# Patient Record
Sex: Female | Born: 1980 | Race: White | Hispanic: No | Marital: Married | Smoking: Former smoker
Health system: Southern US, Community
[De-identification: ages and names within clinical notes are randomized; demographics above are authoritative.]

## PROBLEM LIST (undated history)

## (undated) ENCOUNTER — Inpatient Hospital Stay: Payer: Self-pay

## (undated) DIAGNOSIS — R609 Edema, unspecified: Secondary | ICD-10-CM

## (undated) DIAGNOSIS — I493 Ventricular premature depolarization: Secondary | ICD-10-CM

## (undated) DIAGNOSIS — I10 Essential (primary) hypertension: Secondary | ICD-10-CM

## (undated) DIAGNOSIS — R252 Cramp and spasm: Secondary | ICD-10-CM

## (undated) DIAGNOSIS — F419 Anxiety disorder, unspecified: Secondary | ICD-10-CM

## (undated) DIAGNOSIS — IMO0001 Reserved for inherently not codable concepts without codable children: Secondary | ICD-10-CM

## (undated) HISTORY — DX: Ventricular premature depolarization: I49.3

## (undated) HISTORY — DX: Anxiety disorder, unspecified: F41.9

## (undated) HISTORY — DX: Essential (primary) hypertension: I10

---

## 2015-03-25 ENCOUNTER — Emergency Department

## 2015-03-25 ENCOUNTER — Emergency Department
Admission: EM | Admit: 2015-03-25 | Discharge: 2015-03-25 | Disposition: A | Discharge status: Home / Self Care | Attending: Emergency Medicine | Admitting: Emergency Medicine

## 2015-03-25 ENCOUNTER — Encounter: Payer: Self-pay | Admitting: Emergency Medicine

## 2015-03-25 DIAGNOSIS — X58XXXA Exposure to other specified factors, initial encounter: Secondary | ICD-10-CM | POA: Insufficient documentation

## 2015-03-25 DIAGNOSIS — Y9389 Activity, other specified: Secondary | ICD-10-CM | POA: Insufficient documentation

## 2015-03-25 DIAGNOSIS — Y9289 Other specified places as the place of occurrence of the external cause: Secondary | ICD-10-CM | POA: Diagnosis not present

## 2015-03-25 DIAGNOSIS — S299XXA Unspecified injury of thorax, initial encounter: Secondary | ICD-10-CM | POA: Insufficient documentation

## 2015-03-25 DIAGNOSIS — Y998 Other external cause status: Secondary | ICD-10-CM | POA: Insufficient documentation

## 2015-03-25 DIAGNOSIS — S4991XA Unspecified injury of right shoulder and upper arm, initial encounter: Secondary | ICD-10-CM | POA: Diagnosis present

## 2015-03-25 DIAGNOSIS — Z720 Tobacco use: Secondary | ICD-10-CM | POA: Diagnosis not present

## 2015-03-25 DIAGNOSIS — S46911A Strain of unspecified muscle, fascia and tendon at shoulder and upper arm level, right arm, initial encounter: Secondary | ICD-10-CM

## 2015-03-25 MED ORDER — DIAZEPAM 2 MG PO TABS: 2.0000 mg | ORAL_TABLET | Freq: Three times a day (TID) | ORAL | Status: DC | PRN

## 2015-03-25 MED ORDER — ORPHENADRINE CITRATE 30 MG/ML IJ SOLN
60.0000 mg | INTRAMUSCULAR | Status: AC
  Administered 2015-03-25: 60 mg via INTRAMUSCULAR

## 2015-03-25 MED ORDER — KETOROLAC TROMETHAMINE 10 MG PO TABS: 10.0000 mg | ORAL_TABLET | Freq: Three times a day (TID) | ORAL | Status: DC

## 2015-03-25 MED ORDER — KETOROLAC TROMETHAMINE 60 MG/2ML IM SOLN
60.0000 mg | Freq: Once | INTRAMUSCULAR | Status: AC
  Administered 2015-03-25: 60 mg via INTRAMUSCULAR

## 2015-03-25 MED ORDER — ORPHENADRINE CITRATE 30 MG/ML IJ SOLN
INTRAMUSCULAR | Status: AC
  Administered 2015-03-25: 60 mg via INTRAMUSCULAR
  Filled 2015-03-25: qty 2

## 2015-03-25 MED ORDER — KETOROLAC TROMETHAMINE 60 MG/2ML IM SOLN
INTRAMUSCULAR | Status: AC
  Administered 2015-03-25: 60 mg via INTRAMUSCULAR
  Filled 2015-03-25: qty 2

## 2015-03-25 NOTE — ED Notes (Signed)
C/o right shoulder pain that started 2 weeks ago. States it has become increasingly worse.  Pain is 7 out of 10.

## 2015-03-25 NOTE — Discharge Instructions (Signed)
Muscle Strain A muscle strain (pulled muscle) happens when a muscle is stretched beyond normal length. It happens when a sudden, violent force stretches your muscle too far. Usually, a few of the fibers in your muscle are torn. Muscle strain is common in athletes. Recovery usually takes 1-2 weeks. Complete healing takes 5-6 weeks.  HOME CARE   Follow the PRICE method of treatment to help your injury get better. Do this the first 2-3 days after the injury:  Protect. Protect the muscle to keep it from getting injured again.  Rest. Limit your activity and rest the injured body part.  Ice. Put ice in a plastic bag. Place a towel between your skin and the bag. Then, apply the ice and leave it on from 15-20 minutes each hour. After the third day, switch to moist heat packs.  Compression. Use a splint or elastic bandage on the injured area for comfort. Do not put it on too tightly.  Elevate. Keep the injured body part above the level of your heart.  Only take medicine as told by your doctor.  Warm up before doing exercise to prevent future muscle strains. GET HELP IF:   You have more pain or puffiness (swelling) in the injured area.  You feel numbness, tingling, or notice a loss of strength in the injured area. MAKE SURE YOU:   Understand these instructions.  Will watch your condition.  Will get help right away if you are not doing well or get worse. Document Released: 07/16/2008 Document Revised: 07/28/2013 Document Reviewed: 05/06/2013 [facility] Patient Information 2015 [place], [place]. This information is not intended to replace advice given to you by your health care provider. Make sure you discuss any questions you have with your health care provider.  Take the prescription meds as directed.  Apply ice and moist heat to reduce symptoms.  Follow-up with your provider as scheduled for further care.  Consider seeing a massage therapist or chiropractor for prolonged symptoms.

## 2015-03-25 NOTE — ED Notes (Signed)
Reports pain in right shoulder x 2 weeks.  States it feels like glass is in the joint

## 2015-03-25 NOTE — ED Notes (Signed)
Pt unable to sign due to computer down in room, instructions and RX given to patient.

## 2015-03-25 NOTE — ED Provider Notes (Signed)
[facility] Provider Note ____________________________________________  Time seen: 1323  I have reviewed the triage vital signs and the nursing notes.  HISTORY  Chief Complaint Shoulder Pain  HPI Michelle Cosslizabeth Eoff is a 34 y.o. female reports to the ED with a 2 week complaint of posterior right upper back pain. She denies any known injury or trauma accident or fall. She does admit to working out regularly, but is unclear of any specific weight lifting injury as well. She describes pain between the right shoulder blade and spine, that is very exquisitely tender to touch. He denies any chest pain shortness of breath or referral of pain. She has doses Motrin, and applied Salonpas, and heating pad without significant relief. Symptoms increase with movement of the upper arm and shoulder. Patient rates pain at 7/10 at triage.  History reviewed. No pertinent past medical history.  There are no active problems to display for this patient.  Past Surgical History  Procedure Laterality Date  . Cesarean section     Current Outpatient Rx  Name  Route  Sig  Dispense  Refill  . diazepam (VALIUM) 2 MG tablet   Oral   Take 1 tablet (2 mg total) by mouth every 8 (eight) hours as needed for muscle spasms.   10 tablet   0   . ketorolac (TORADOL) 10 MG tablet   Oral   Take 1 tablet (10 mg total) by mouth every 8 (eight) hours.   15 tablet   0    Allergies Review of patient's allergies indicates no known allergies.  History reviewed. No pertinent family history.  Social History History  Substance Use Topics  . Smoking status: Current Every Day Smoker  . Smokeless tobacco: Not on file  . Alcohol Use: Yes   Review of Systems  Constitutional: Negative for fever. Eyes: Negative for visual changes. ENT: Negative for sore throat. Cardiovascular: Negative for chest pain. Respiratory: Negative for shortness of breath. Gastrointestinal: Negative  for abdominal pain, vomiting and diarrhea. Genitourinary: Negative for dysuria. Musculoskeletal: Positive for upper back pain. Positive for right shoulder pain. Skin: Negative for rash. Neurological: Negative for headaches, focal weakness or numbness. ___________________________________________  PHYSICAL EXAM:  VITAL SIGNS: ED Triage Vitals  Enc Vitals Group     BP 03/25/15 1231 160/91 mmHg     Pulse Rate 03/25/15 1231 81     Resp 03/25/15 1231 20     Temp 03/25/15 1231 98.1 F (36.7 C)     Temp Source 03/25/15 1231 Oral     SpO2 03/25/15 1231 99 %     Weight 03/25/15 1231 150 lb (68.04 kg)     Height 03/25/15 1231 5\' 6"  (1.676 m)     Head Cir --      Peak Flow --      Pain Score 03/25/15 1232 7     Pain Loc --      Pain Edu? --      Excl. in GC? --    Constitutional: Alert and oriented. Well appearing and in no distress. Eyes: Conjunctivae are normal. PERRL. Normal extraocular movements. ENT   Head: Normocephalic and atraumatic.   Nose: No congestion/rhinnorhea.   Mouth/Throat: Mucous membranes are moist.   Neck: No stridor. Hematological/Lymphatic/Immunilogical: No cervical lymphadenopathy. Cardiovascular: Normal rate, regular rhythm.  Respiratory: Normal respiratory effort.No wheezes/rales/rhonchi. Gastrointestinal: Soft and nontender. No distention. Musculoskeletal: Nontender with normal range of motion in all extremities. Patient tender to touch with palpable muscle fullness to the right  upper rhomboid. Normal rotator cuff strength testing.  Neurologic:  Normal speech and language. No gross focal neurologic deficits are appreciated. CN II-XII grossly intact.  Skin:  Skin is warm, dry and intact. No rash noted. Psychiatric: Mood and affect are normal. Patient exhibits appropriate insight and judgment. ____________________________________________   RADIOLOGY Right Shoulder - negative ____________________________________________  PROCEDURES  Toradol 60  mg IM  Norflex 60 mg IM ____________________________________________  INITIAL IMPRESSION / ASSESSMENT AND PLAN / ED COURSE  Reassurance to patient about about acute scapulothoracic muscle strain and spasm.  Discussed treatment with anti-inflammatory - Toradol and Valium for spasms and ice.  Demonstrated tissue mobilization techniques and suggested chiropractor or massage therapy.  ____________________________________________  FINAL CLINICAL IMPRESSION(S) / ED DIAGNOSES  Final diagnoses:  Muscle strain of scapular region, right, initial encounter     Lissa Hoard, PA-C 03/25/15 1918  Jene Every, MD 03/26/15 [phone]

## 2015-06-28 ENCOUNTER — Emergency Department
Admission: EM | Admit: 2015-06-28 | Discharge: 2015-06-28 | Discharge status: Against Medical Advice | Attending: Emergency Medicine | Admitting: Emergency Medicine

## 2015-06-28 ENCOUNTER — Encounter: Payer: Self-pay | Admitting: Emergency Medicine

## 2015-06-28 DIAGNOSIS — R110 Nausea: Secondary | ICD-10-CM | POA: Insufficient documentation

## 2015-06-28 DIAGNOSIS — R531 Weakness: Secondary | ICD-10-CM | POA: Insufficient documentation

## 2015-06-28 DIAGNOSIS — Z720 Tobacco use: Secondary | ICD-10-CM | POA: Diagnosis not present

## 2015-06-28 LAB — BASIC METABOLIC PANEL
Anion gap: 8 (ref 5–15)
BUN: 18 mg/dL (ref 6–20)
CO2: 23 mmol/L (ref 22–32)
Calcium: 9.9 mg/dL (ref 8.9–10.3)
Chloride: 107 mmol/L (ref 101–111)
Creatinine, Ser: 0.71 mg/dL (ref 0.44–1.00)
GFR calc Af Amer: >60 mL/min (ref >60)
GFR calc non Af Amer: >60 mL/min (ref >60)
GLUCOSE: 108 mg/dL — AB (ref 65–99)
POTASSIUM: 4 mmol/L (ref 3.5–5.1)
Sodium: 138 mmol/L (ref 135–145)

## 2015-06-28 LAB — CBC
HEMATOCRIT: 43.7 % (ref 35.0–47.0)
Hemoglobin: 14.7 g/dL (ref 12.0–16.0)
MCH: 32.1 pg (ref 26.0–34.0)
MCHC: 33.8 g/dL (ref 32.0–36.0)
MCV: 95.1 fL (ref 80.0–100.0)
Platelets: 318 10*3/uL (ref 150–440)
RBC: 4.59 MIL/uL (ref 3.80–5.20)
RDW: 12.5 % (ref 11.5–14.5)
WBC: 10.9 10*3/uL (ref 3.6–11.0)

## 2015-06-28 LAB — URINALYSIS COMPLETE WITH MICROSCOPIC (ARMC ONLY)
Bilirubin Urine: NEGATIVE
GLUCOSE, UA: NEGATIVE mg/dL
Hgb urine dipstick: NEGATIVE
Ketones, ur: NEGATIVE mg/dL
NITRITE: NEGATIVE
Protein, ur: NEGATIVE mg/dL
Specific Gravity, Urine: 1.008 (ref 1.005–1.030)
pH: 6 (ref 5.0–8.0)

## 2015-06-28 NOTE — ED Notes (Signed)
Pt here for weakness and nausea. States she feels at though she is going to pass out.

## 2015-06-29 ENCOUNTER — Telehealth: Payer: Self-pay | Admitting: Emergency Medicine

## 2015-06-29 NOTE — ED Notes (Signed)
Called patient due to lwot to inquire about condition and follow up plans. Patient says she has appt today for follow up.

## 2015-07-10 ENCOUNTER — Other Ambulatory Visit: Payer: Self-pay | Admitting: Orthopedic Surgery

## 2015-07-10 DIAGNOSIS — M958 Other specified acquired deformities of musculoskeletal system: Secondary | ICD-10-CM

## 2015-07-17 ENCOUNTER — Other Ambulatory Visit: Payer: Self-pay | Admitting: Orthopedic Surgery

## 2015-07-17 ENCOUNTER — Ambulatory Visit
Admission: RE | Admit: 2015-07-17 | Discharge: 2015-07-17 | Disposition: A | Discharge status: Home / Self Care | Source: Ambulatory Visit | Attending: Orthopedic Surgery | Admitting: Orthopedic Surgery

## 2015-07-17 DIAGNOSIS — M958 Other specified acquired deformities of musculoskeletal system: Secondary | ICD-10-CM

## 2015-07-17 MED ORDER — GADOBENATE DIMEGLUMINE 529 MG/ML IV SOLN
15.0000 mL | Freq: Once | INTRAVENOUS | Status: AC | PRN
  Administered 2015-07-17: 14 mL via INTRAVENOUS

## 2015-10-02 ENCOUNTER — Encounter: Payer: Self-pay | Admitting: Certified Nurse Midwife

## 2015-10-02 ENCOUNTER — Ambulatory Visit (INDEPENDENT_AMBULATORY_CARE_PROVIDER_SITE_OTHER): Admitting: Certified Nurse Midwife

## 2015-10-02 VITALS — BP 124/91 | HR 78 | Ht 65.6 in | Wt 153.2 lb

## 2015-10-02 DIAGNOSIS — N926 Irregular menstruation, unspecified: Secondary | ICD-10-CM | POA: Diagnosis not present

## 2015-10-02 LAB — POCT URINE PREGNANCY: PREG TEST UR: POSITIVE — AB

## 2015-10-02 NOTE — Progress Notes (Signed)
Patient ID: Michelle CossElizabeth Owen, female   DOB: [birth year], 34 y.o.   MRN: [id number] Pt presents for confirmation of pregnancy. UPT: POSITIVE. LMP: 08/24/2015. EGA: 5.4 days; EDD: 05/30/2016.  G2 P1001. No vaginal bleeding. Having some period like cramping, a little nausea.

## 2015-10-02 NOTE — Progress Notes (Signed)
Patient ID: Michelle Owen, female   DOB: June 12, 1981, 34 y.o.   MRN: [id number]  Chief Complaint  Patient presents with  . Establish Care    confirmation of pregnancy    HPI Michelle Cosslizabeth Knerr is a 34 y.o. female.  Presents for missed menses.   Subjective:    Michelle Cosslizabeth Gelles is a 34 y.o. female who presents for evaluation of amenorrhea. She believes she could be pregnant. Pregnancy is desired.  This is an unplanned pregnancy.  She just adopted her two nieces.  Sexual Activity: single partner, contraception: none. Current symptoms also include: breast tenderness, fatigue, nausea and positive home pregnancy test. She has not used birth control in 5 years.Last period was normal.   Patient's last menstrual period was 08/24/2015 (exact date). The following portions of the patient's history were reviewed and updated as appropriate: allergies, current medications, past family history, past medical history, past social history, past surgical history and problem list.  Review of Systems Pertinent items noted in HPI and remainder of comprehensive ROS otherwise negative.     HPI  Past Medical History  Diagnosis Date  . Anxiety   No Medications at this time for anxiety.  She has used valium in the past.  Past Surgical History  Procedure Laterality Date  . Cesarean section    For fetal distress, Nuchal cord.  No family history on file.  Social History Social History  Substance Use Topics  . Smoking status: Former Smoker    Quit date: 09/02/2015  . Smokeless tobacco: Never Used  . Alcohol Use: No    No Known Allergies  No current outpatient prescriptions on file.   No current facility-administered medications for this visit.    Review of Systems Review of Systems  Constitutional: Positive for activity change and appetite change.  Respiratory: Negative for shortness of breath.   Cardiovascular: Negative for chest pain.  Gastrointestinal: Negative for abdominal pain and  abdominal distention.  Genitourinary: Negative for difficulty urinating, menstrual problem and pelvic pain.    Blood pressure 124/91, pulse 78, height 5' 5.6" (1.666 m), weight 153 lb 3 oz (69.485 kg), last menstrual period 08/24/2015.  Physical Exam Physical Exam  Constitutional: She is oriented to person, place, and time. She appears well-developed and well-nourished.  Cardiovascular: Normal rate and regular rhythm.   Pulmonary/Chest: Effort normal and breath sounds normal.  Abdominal: Soft. She exhibits no distension. There is no tenderness.  Neurological: She is alert and oriented to person, place, and time. She has normal reflexes.  Skin: Skin is warm and dry.  Psychiatric: She has a normal mood and affect. Her behavior is normal. Judgment and thought content normal.  Nursing note and vitals reviewed.   Data Reviewed Pregnancy test positive  Assessment    Missed menses     Plan    Return in 4 weeks for dating scan & NOB intake        Ardelia MemsSharon Kenzley Ke 10/02/2015, 3:53 PM

## 2015-10-22 NOTE — L&D Delivery Note (Signed)
Delivery Summary for [facility]  Labor Events:   Preterm labor:   Rupture date:   Rupture time:   Rupture type:   Fluid Color:   Induction:   Augmentation:   Complications:   Cervical ripening:          Delivery:   Episiotomy:   Lacerations:   Repair suture:   Repair # of packets:   Blood loss (ml): 500 ml   Information for the patient's newborn:  Bernetta, Desarro [[id number]]    Delivery 05/15/2016 2:09 PM by  C-Section, Low Transverse Sex:  female Gestational Age: [redacted]w[redacted]d Delivery Clinician:   Living?:         APGARS  One minute Five minutes Ten minutes  Skin color:        Heart rate:        Grimace:        Muscle tone:        Breathing:        Totals: 8  9      Presentation/position:      Resuscitation:   Cord information:    Disposition of cord blood:     Blood gases sent?  Complications:   Placenta: Delivered:       appearance Newborn Measurements: Weight: 6 lb 15.1 oz (3150 g)  Height: 19.09"  Head circumference:    Chest circumference:    Other providers:    Additional  information: Forceps:   Vacuum:   Breech:   Observed anomalies        See. Dr. Oretha Milch operative note for details of C-section procedure.    Hildred Laser, MD [facility]

## 2015-10-23 ENCOUNTER — Encounter: Payer: Self-pay | Admitting: Emergency Medicine

## 2015-10-23 ENCOUNTER — Emergency Department
Admission: EM | Admit: 2015-10-23 | Discharge: 2015-10-23 | Disposition: A | Discharge status: Home / Self Care | Attending: Emergency Medicine | Admitting: Emergency Medicine

## 2015-10-23 DIAGNOSIS — R42 Dizziness and giddiness: Secondary | ICD-10-CM | POA: Diagnosis not present

## 2015-10-23 DIAGNOSIS — O9989 Other specified diseases and conditions complicating pregnancy, childbirth and the puerperium: Secondary | ICD-10-CM | POA: Insufficient documentation

## 2015-10-23 DIAGNOSIS — Z3A08 8 weeks gestation of pregnancy: Secondary | ICD-10-CM | POA: Diagnosis not present

## 2015-10-23 DIAGNOSIS — Z87891 Personal history of nicotine dependence: Secondary | ICD-10-CM | POA: Diagnosis not present

## 2015-10-23 DIAGNOSIS — Z79899 Other long term (current) drug therapy: Secondary | ICD-10-CM | POA: Insufficient documentation

## 2015-10-23 DIAGNOSIS — R51 Headache: Secondary | ICD-10-CM | POA: Diagnosis not present

## 2015-10-23 DIAGNOSIS — R030 Elevated blood-pressure reading, without diagnosis of hypertension: Secondary | ICD-10-CM | POA: Insufficient documentation

## 2015-10-23 LAB — CBC
HCT: 41.8 % (ref 35.0–47.0)
HEMOGLOBIN: 14.3 g/dL (ref 12.0–16.0)
MCH: 32.1 pg (ref 26.0–34.0)
MCHC: 34.2 g/dL (ref 32.0–36.0)
MCV: 94 fL (ref 80.0–100.0)
Platelets: 297 10*3/uL (ref 150–440)
RBC: 4.45 MIL/uL (ref 3.80–5.20)
RDW: 12.6 % (ref 11.5–14.5)
WBC: 15.7 10*3/uL — ABNORMAL HIGH (ref 3.6–11.0)

## 2015-10-23 LAB — COMPREHENSIVE METABOLIC PANEL
ALK PHOS: 61 U/L (ref 38–126)
ALT: 17 U/L (ref 14–54)
ANION GAP: 8 (ref 5–15)
AST: 17 U/L (ref 15–41)
Albumin: 3.9 g/dL (ref 3.5–5.0)
BILIRUBIN TOTAL: 0.7 mg/dL (ref 0.3–1.2)
BUN: 11 mg/dL (ref 6–20)
CALCIUM: 9.3 mg/dL (ref 8.9–10.3)
CO2: 23 mmol/L (ref 22–32)
Chloride: 105 mmol/L (ref 101–111)
Creatinine, Ser: 0.48 mg/dL (ref 0.44–1.00)
GFR calc non Af Amer: >60 mL/min (ref >60)
Glucose, Bld: 85 mg/dL (ref 65–99)
Potassium: 3.5 mmol/L (ref 3.5–5.1)
SODIUM: 136 mmol/L (ref 135–145)
TOTAL PROTEIN: 7.1 g/dL (ref 6.5–8.1)

## 2015-10-23 NOTE — ED Notes (Signed)
Pt states she has had headache x 1 week intermittently and she has checked her bp twice today and it was 140/95, then 150/110. Pt states her OB-GYN office is closed today. She is [redacted] weeks pregnant with no complications thus far. Pt alert & oriented with NAD.

## 2015-10-23 NOTE — ED Notes (Signed)
Pt to ed with c/o headache, nausea, elevated blood pressure x 2 weeks.  Pt states she is approx [redacted] weeks pregnant,  G2, P1,  Seen at [facility].

## 2015-10-23 NOTE — ED Provider Notes (Signed)
[facility] Emergency Department Provider Note  ____________________________________________  Time seen: On arrival  I have reviewed the triage vital signs and the nursing notes.   HISTORY  Chief Complaint Headache and Hypertension    HPI Michelle Owen is a 35 y.o. female who presents with complaints of intermittent headache for one week and high blood pressure. Patient reports she is [redacted] weeks pregnant and was feelingtired and slightly dizzy today so checked her blood pressure at a pharmacy and noted to be elevated. Given that she is pretty she became concerned and came to the emergency department. She reports she is feeling better here. She follows with [facility]. No abdominal pain no vaginal bleeding. No issues during this pregnancy. No visual changes, no neuro deficits. No dysuria, no cough, no rash     Past Medical History  Diagnosis Date  . Anxiety     There are no active problems to display for this patient.   Past Surgical History  Procedure Laterality Date  . Cesarean section      Current Outpatient Rx  Name  Route  Sig  Dispense  Refill  . Prenatal Vit-Fe Fumarate-FA (PRENATAL MULTIVITAMIN) TABS tablet   Oral   Take 1 tablet by mouth daily at 12 noon.           Allergies Review of patient's allergies indicates no known allergies.  History reviewed. No pertinent family history.  Social History Social History  Substance Use Topics  . Smoking status: Former Smoker    Quit date: 09/02/2015  . Smokeless tobacco: Never Used  . Alcohol Use: No    Review of Systems  Constitutional: Negative for fever. Eyes: Negative for visual changes. ENT: Negative for sore throat Cardiovascular: Negative for chest pain. Respiratory: Negative for cough Gastrointestinal: Negative for abdominal pain no pelvic pain Genitourinary: Negative for dysuria. She'll bleeding Musculoskeletal: Negative for neck pain Skin: Negative for  rash. Neurological: Negative  focal weakness Psychiatric: No anxiety    ____________________________________________   PHYSICAL EXAM:  VITAL SIGNS: ED Triage Vitals  Enc Vitals Group     BP 10/23/15 1042 159/100 mmHg     Pulse Rate 10/23/15 1042 92     Resp 10/23/15 1042 18     Temp 10/23/15 1042 97.5 F (36.4 C)     Temp Source 10/23/15 1042 Oral     SpO2 10/23/15 1042 100 %     Weight 10/23/15 1042 155 lb (70.308 kg)     Height 10/23/15 1042 5\' 7"  (1.702 m)     Head Cir --      Peak Flow --      Pain Score 10/23/15 1043 0     Pain Loc --      Pain Edu? --      Excl. in GC? --      Constitutional: Alert and oriented. Well appearing and in no distress. Eyes: Conjunctivae are normal.  ENT   Head: Normocephalic and atraumatic.   Mouth/Throat: Mucous membranes are moist. Cardiovascular: Normal rate, regular rhythm. Normal and symmetric distal pulses are present in all extremities. Respiratory: Normal respiratory effort without tachypnea nor retractions.  Gastrointestinal: Soft and non-tender in all quadrants. No distention. There is no CVA tenderness. Genitourinary: deferred Musculoskeletal: Nontender with normal range of motion in all extremities. No lower extremity tenderness nor edema. Neurologic:  Normal speech and language. No gross focal neurologic deficits are appreciated. Skin:  Skin is warm, dry and intact. No rash noted. Psychiatric: Mood and affect  are normal. Patient exhibits appropriate insight and judgment.  ____________________________________________    LABS (pertinent positives/negatives)  Labs Reviewed  CBC - Abnormal; Notable for the following:    WBC 15.7 (*)    All other components within normal limits  COMPREHENSIVE METABOLIC PANEL    ____________________________________________   EKG  None ____________________________________________    RADIOLOGY I have personally reviewed any xrays that were ordered on this  patient: None  ____________________________________________   PROCEDURES  Procedure(s) performed: none  Critical Care performed: none  ____________________________________________   INITIAL IMPRESSION / ASSESSMENT AND PLAN / ED COURSE  Pertinent labs & imaging results that were available during my care of the patient were reviewed by me and considered in my medical decision making (see chart for details).  Patient well-appearing and in no distress. Blood pressures are improved in the emergency department without intervention. Labs are significant for a mildly elevated white blood cell count of uncertain significance. No evidence of infection based on her history of present illness an vitals.   Discussed with Dr. Valentino Saxonherry of [facility] recommends outpatient follow-up and no further ed testing ____________________________________________   FINAL CLINICAL IMPRESSION(S) / ED DIAGNOSES  Final diagnoses:  Elevated blood pressure (not hypertension)     Jene Everyobert Melanee Cordial, MD 10/23/15 [phone]

## 2015-10-24 ENCOUNTER — Encounter: Payer: Self-pay | Admitting: Certified Nurse Midwife

## 2015-10-24 ENCOUNTER — Ambulatory Visit (INDEPENDENT_AMBULATORY_CARE_PROVIDER_SITE_OTHER): Admitting: Certified Nurse Midwife

## 2015-10-24 VITALS — BP 124/103 | HR 100 | Wt 154.5 lb

## 2015-10-24 DIAGNOSIS — Z331 Pregnant state, incidental: Secondary | ICD-10-CM

## 2015-10-24 DIAGNOSIS — R030 Elevated blood-pressure reading, without diagnosis of hypertension: Secondary | ICD-10-CM

## 2015-10-24 DIAGNOSIS — Z3490 Encounter for supervision of normal pregnancy, unspecified, unspecified trimester: Secondary | ICD-10-CM

## 2015-10-24 DIAGNOSIS — IMO0001 Reserved for inherently not codable concepts without codable children: Secondary | ICD-10-CM

## 2015-10-24 LAB — POCT URINALYSIS DIPSTICK
Bilirubin, UA: NEGATIVE
Glucose, UA: NEGATIVE
Ketones, UA: NEGATIVE
Leukocytes, UA: NEGATIVE
NITRITE UA: NEGATIVE
RBC UA: NEGATIVE
Spec Grav, UA: 1.02
UROBILINOGEN UA: NEGATIVE
pH, UA: 6

## 2015-10-24 MED ORDER — LABETALOL HCL 100 MG PO TABS: 100.0000 mg | ORAL_TABLET | Freq: Two times a day (BID) | ORAL | Status: DC

## 2015-10-24 NOTE — Progress Notes (Signed)
Subjective:     Patient ID: Michelle Owen, female   DOB: 10-13-1981, 35 y.o.   MRN: [id number]  HPI  Patient denies any history of blood pressure issues in the past.  Her blood pressure has been elevated her last 4 doctor visits (once here) and was seen in ED for elevated blood pressure   Review of Systems  Eyes: Positive for visual disturbance.  Respiratory: Negative for cough and shortness of breath.   Cardiovascular: Negative for chest pain and leg swelling.  Genitourinary: Negative for flank pain and dyspareunia.  Neurological: Positive for headaches.       Seeing spots  Psychiatric/Behavioral: The patient is nervous/anxious.        She is raising her sisters kids       Objective:   Physical Exam  Constitutional: She is oriented to person, place, and time. She appears well-developed and well-nourished.  Cardiovascular: Normal rate.   Pulmonary/Chest: Effort normal and breath sounds normal.  Neurological: She is alert and oriented to person, place, and time. She has normal reflexes.  Skin: Skin is warm and dry.  No edema  Nursing note and vitals reviewed.      Assessment:     Elevated BP in first trimester of pregnancy     Plan:     Reviewed patient with Dr Valentino Saxonherry and will do PIH labs, start Labetalol 100 mg BID and transfer care to Dr Valentino Saxonherry for prenatal care.  Keep NOB intake appointment.  See Dr. Valentino Saxonherry in 2 weeks.

## 2015-10-25 LAB — CBC WITH DIFFERENTIAL/PLATELET
BASOS: 0 %
Basophils Absolute: 0 10*3/uL (ref 0.0–0.2)
EOS (ABSOLUTE): 0 10*3/uL (ref 0.0–0.4)
EOS: 0 %
HEMATOCRIT: 40.9 % (ref 34.0–46.6)
Hemoglobin: 14.6 g/dL (ref 11.1–15.9)
IMMATURE GRANULOCYTES: 0 %
Immature Grans (Abs): 0 10*3/uL (ref 0.0–0.1)
LYMPHS ABS: 2.5 10*3/uL (ref 0.7–3.1)
Lymphs: 18 %
MCH: 32.8 pg (ref 26.6–33.0)
MCHC: 35.7 g/dL (ref 31.5–35.7)
MCV: 92 fL (ref 79–97)
MONOS ABS: 0.7 10*3/uL (ref 0.1–0.9)
Monocytes: 5 %
NEUTROS ABS: 10.6 10*3/uL — AB (ref 1.4–7.0)
NEUTROS PCT: 77 %
PLATELETS: 367 10*3/uL (ref 150–379)
RBC: 4.45 x10E6/uL (ref 3.77–5.28)
RDW: 12.3 % (ref 12.3–15.4)
WBC: 13.8 10*3/uL — ABNORMAL HIGH (ref 3.4–10.8)

## 2015-10-25 LAB — PROTEIN / CREATININE RATIO, URINE
CREATININE, UR: 208 mg/dL
PROTEIN UR: 26.6 mg/dL
PROTEIN/CREAT RATIO: 128 mg/g{creat} (ref 0–200)

## 2015-10-25 LAB — RENAL FUNCTION PANEL
Albumin: 4.4 g/dL (ref 3.5–5.5)
BUN / CREAT RATIO: 22 — AB (ref 8–20)
BUN: 12 mg/dL (ref 6–20)
CHLORIDE: 97 mmol/L (ref 96–106)
CO2: 22 mmol/L (ref 18–29)
Calcium: 9.6 mg/dL (ref 8.7–10.2)
Creatinine, Ser: 0.55 mg/dL — ABNORMAL LOW (ref 0.57–1.00)
GFR, EST AFRICAN AMERICAN: 142 mL/min/{1.73_m2} (ref >59)
GFR, EST NON AFRICAN AMERICAN: 123 mL/min/{1.73_m2} (ref >59)
GLUCOSE: 87 mg/dL (ref 65–99)
POTASSIUM: 3.8 mmol/L (ref 3.5–5.2)
Phosphorus: 4.8 mg/dL — ABNORMAL HIGH (ref 2.5–4.5)
SODIUM: 135 mmol/L (ref 134–144)

## 2015-10-25 LAB — HEPATIC FUNCTION PANEL
ALT: 18 IU/L (ref 0–32)
AST: 18 IU/L (ref 0–40)
Alkaline Phosphatase: 71 IU/L (ref 39–117)
Bilirubin Total: 0.5 mg/dL (ref 0.0–1.2)
Bilirubin, Direct: 0.13 mg/dL (ref 0.00–0.40)
Total Protein: 7.4 g/dL (ref 6.0–8.5)

## 2015-10-25 LAB — MICROALBUMIN / CREATININE URINE RATIO
MICROALB/CREAT RATIO: 13.8 mg/g creat (ref 0.0–30.0)
Microalbumin, Urine: 28.7 ug/mL

## 2015-10-25 LAB — URIC ACID: Uric Acid: 3 mg/dL (ref 2.5–7.1)

## 2015-10-30 ENCOUNTER — Other Ambulatory Visit: Payer: Self-pay | Admitting: Certified Nurse Midwife

## 2015-10-30 DIAGNOSIS — Z3687 Encounter for antenatal screening for uncertain dates: Secondary | ICD-10-CM

## 2015-11-03 ENCOUNTER — Ambulatory Visit (INDEPENDENT_AMBULATORY_CARE_PROVIDER_SITE_OTHER)

## 2015-11-03 ENCOUNTER — Other Ambulatory Visit: Payer: Self-pay

## 2015-11-03 VITALS — BP 115/74 | HR 92 | Wt 148.9 lb

## 2015-11-03 DIAGNOSIS — Z113 Encounter for screening for infections with a predominantly sexual mode of transmission: Secondary | ICD-10-CM

## 2015-11-03 DIAGNOSIS — R030 Elevated blood-pressure reading, without diagnosis of hypertension: Secondary | ICD-10-CM

## 2015-11-03 DIAGNOSIS — Z331 Pregnant state, incidental: Secondary | ICD-10-CM

## 2015-11-03 DIAGNOSIS — Z36 Encounter for antenatal screening of mother: Secondary | ICD-10-CM

## 2015-11-03 DIAGNOSIS — Z1389 Encounter for screening for other disorder: Secondary | ICD-10-CM

## 2015-11-03 DIAGNOSIS — T7589XA Other specified effects of external causes, initial encounter: Secondary | ICD-10-CM

## 2015-11-03 DIAGNOSIS — Z3490 Encounter for supervision of normal pregnancy, unspecified, unspecified trimester: Secondary | ICD-10-CM

## 2015-11-03 DIAGNOSIS — Z369 Encounter for antenatal screening, unspecified: Secondary | ICD-10-CM

## 2015-11-03 DIAGNOSIS — Z3687 Encounter for antenatal screening for uncertain dates: Secondary | ICD-10-CM

## 2015-11-03 DIAGNOSIS — IMO0001 Reserved for inherently not codable concepts without codable children: Secondary | ICD-10-CM

## 2015-11-03 NOTE — Progress Notes (Signed)
   Michelle Owen presents for NOB nurse interview visit. G-2.  P-1001. Pregnancy education material explained and given. _YES__ cats in the home. NOB labs ordered.  HIV labs and Drug screen were explained optional and she could opt out of tests but did not decline. Drug screen ordered. PNV encouraged. NT to discuss with provider. Pt. To follow up with provider in 1 weeks for NOB physical.  All questions answered.  ZIKA EXPOSURE SCREEN:  The patient has not traveled to a [place] endemic area within the past 6 months, nor has she had unprotected sex with a partner who has travelled to a [place] endemic region within the past 6 months. The patient has been advised to notify us if these factors change any time during this current pregnancy, so adequate testing and monitoring can be initiated.

## 2015-11-03 NOTE — Patient Instructions (Addendum)
Pregnancy and Zika Virus Disease Zika virus disease, or Zika, is an illness that can spread to people from mosquitoes that carry the virus. It may also spread from person to person through infected body fluids. Zika first occurred in [place], but recently it has spread to new areas. The virus occurs in tropical climates. The location of Zika continues to change. Most people who become infected with Zika virus do not develop serious illness. However, Zika may cause birth defects in an unborn baby whose mother is infected with the virus. It may also increase the risk of miscarriage. WHAT ARE THE SYMPTOMS OF ZIKA VIRUS DISEASE? In many cases, people who have been infected with Zika virus do not develop any symptoms. If symptoms appear, they usually start about a week after the person is infected. Symptoms are usually mild. They may include:  Fever.  Rash.  Red eyes.  Joint pain. HOW DOES ZIKA VIRUS DISEASE SPREAD? The main way that Zika virus spreads is through the bite of a certain type of mosquito. Unlike most types of mosquitos, which bite only at night, the type of mosquito that carries Zika virus bites both at night and during the day. Zika virus can also spread through sexual contact, through a blood transfusion, and from a mother to her baby before or during birth. Once you have had Zika virus disease, it is unlikely that you will get it again. CAN I PASS ZIKA TO MY BABY DURING PREGNANCY? Yes, Zika can pass from a mother to her baby before or during birth. WHAT PROBLEMS CAN ZIKA CAUSE FOR MY BABY? A woman who is infected with Zika virus while pregnant is at risk of having her baby born with a condition in which the brain or head is smaller than expected (microcephaly). Babies who have microcephaly can have developmental delays, seizures, hearing problems, and vision problems. Having Zika virus disease during pregnancy can also increase the risk of miscarriage. HOW CAN ZIKA VIRUS DISEASE BE  PREVENTED? There is no vaccine to prevent Zika. The best way to prevent the disease is to avoid infected mosquitoes and avoid exposure to body fluids that can spread the virus. Avoid any possible exposure to Zika by taking the following precautions. For women and their sex partners:  Avoid traveling to high-risk areas. The locations where Zika is being reported change often. To identify high-risk areas, check the CDC travel website: www.cdc.gov/zika/geo/index.html  If you or your sex partner must travel to a high-risk area, talk with a health care provider before and after traveling.  Take all precautions to avoid mosquito bites if you live in, or travel to, any of the high-risk areas. Insect repellents are safe to use during pregnancy.  Ask your health care provider when it is safe to have sexual contact. For women:  If you are pregnant or trying to become pregnant, avoid sexual contact with persons who may have been exposed to Zika virus, persons who have possible symptoms of Zika, or persons whose history you are unsure about. If you choose to have sexual contact with someone who may have been exposed to Zika virus, use condoms correctly during the entire duration of sexual activity, every time. Do not share sexual devices, as you may be exposed to body fluids.  Ask your health care provider about when it is safe to attempt pregnancy after a possible exposure to Zika virus. WHAT STEPS SHOULD I TAKE TO AVOID MOSQUITO BITES? Take these steps to avoid mosquito bites when you are   in a high-risk area:  Wear loose clothing that covers your arms and legs.  Limit your outdoor activities.  Do not open windows unless they have window screens.  Sleep under mosquito nets.  Use insect repellent. The best insect repellents have:  DEET, picaridin, oil of lemon eucalyptus (OLE), or IR3535 in them.  Higher amounts of an active ingredient in them.  Remember that insect repellents are safe to use  during pregnancy.  Do not use OLE on children who are younger than 3 years of age. Do not use insect repellent on babies who are younger than 2 months of age.  Cover your child's stroller with mosquito netting. Make sure the netting fits snugly and that any loose netting does not cover your child's mouth or nose. Do not use a blanket as a mosquito-protection cover.  Do not apply insect repellent underneath clothing.  If you are using sunscreen, apply the sunscreen before applying the insect repellent.  Treat clothing with permethrin. Do not apply permethrin directly to your skin. Follow label directions for safe use.  Get rid of standing water, where mosquitoes may reproduce. Standing water is often found in items such as buckets, bowls, animal food dishes, and flowerpots. When you return from traveling to any high-risk area, continue taking actions to protect yourself against mosquito bites for 3 weeks, even if you show no signs of illness. This will prevent spreading Zika virus to uninfected mosquitoes. WHAT SHOULD I KNOW ABOUT THE SEXUAL TRANSMISSION OF ZIKA? People can spread Zika to their sexual partners during vaginal, anal, or oral sex, or by sharing sexual devices. Many people with Zika do not develop symptoms, so a person could spread the disease without knowing that they are infected. The greatest risk is to women who are pregnant or who may become pregnant. Zika virus can live longer in semen than it can live in blood. Couples can prevent sexual transmission of the virus by:  Using condoms correctly during the entire duration of sexual activity, every time. This includes vaginal, anal, and oral sex.  Not sharing sexual devices. Sharing increases your risk of being exposed to body fluid from another person.  Avoiding all sexual activity until your health care provider says it is safe. SHOULD I BE TESTED FOR ZIKA VIRUS? A sample of your blood can be tested for Zika virus. A pregnant  woman should be tested if she may have been exposed to the virus or if she has symptoms of Zika. She may also have additional tests done during her pregnancy, such ultrasound testing. Talk with your health care provider about which tests are recommended.   This information is not intended to replace advice given to you by your health care provider. Make sure you discuss any questions you have with your health care provider.   Document Released: 06/28/2015 Document Reviewed: 06/21/2015 Elsevier Interactive Patient Education 2016 [employer] Minor Illnesses and Medications in Pregnancy  Cold/Flu:  Sudafed for congestion- Robitussin (plain) for cough- Tylenol for discomfort.  Please follow the directions on the label.  Try not to take any more than needed.  OTC Saline nasal spray and air humidifier or cool-mist  Vaporizer to sooth nasal irritation and to loosen congestion.  It is also important to increase intake of non carbonated fluids, especially if you have a fever.  Constipation:  Colace-2 capsules at bedtime; Metamucil- follow directions on label; Senokot- 1 tablet at bedtime.  Any one of these medications can be used.  It is also   very important to increase fluids and fruits along with regular exercise.  If problem persists please call the office.  Diarrhea:  Kaopectate as directed on the label.  Eat a bland diet and increase fluids.  Avoid highly seasoned foods.  Headache:  Tylenol 1 or 2 tablets every 3-4 hours as needed  Indigestion:  Maalox, Mylanta, Tums or Rolaids- as directed on label.  Also try to eat small meals and avoid fatty, greasy or spicy foods.  Nausea with or without Vomiting:  Nausea in pregnancy is caused by increased levels of hormones in the body which influence the digestive system and cause irritation when stomach acids accumulate.  Symptoms usually subside after 1st trimester of pregnancy.  Try the following:  Keep saltines, graham crackers or dry toast by your bed  to eat upon awakening.  Don't let your stomach get empty.  Try to eat 5-6 small meals per day instead of 3 large ones.  Avoid greasy fatty or highly seasoned foods.   Take OTC Unisom 1 tablet at bed time along with OTC Vitamin B6 25-50 mg 3 times per day.    If nausea continues with vomiting and you are unable to keep down food and fluids you may need a prescription medication.  Please notify your provider.   Sore throat:  Chloraseptic spray, throat lozenges and or plain Tylenol.  Vaginal Yeast Infection:  OTC Monistat for 7 days as directed on label.  If symptoms do not resolve within a week notify provider.  If any of the above problems do not subside with recommended treatment please call the office for further assistance.   Do not take Aspirin, Advil, Motrin or Ibuprofen.  * * OTC= Over the counter Commonly Asked Questions During Pregnancy  Cats: A parasite can be excreted in cat feces.  To avoid exposure you need to have another person empty the little box.  If you must empty the litter box you will need to wear gloves.  Wash your hands after handling your cat.  This parasite can also be found in raw or undercooked meat so this should also be avoided.  Colds, Sore Throats, Flu: Please check your medication sheet to see what you can take for symptoms.  If your symptoms are unrelieved by these medications please call the office.  Dental Work: Most any dental work your dentist recommends is permitted.  X-rays should only be taken during the first trimester if absolutely necessary.  Your abdomen should be shielded with a lead apron during all x-rays.  Please notify your provider prior to receiving any x-rays.  Novocaine is fine; gas is not recommended.  If your dentist requires a note from us prior to dental work please call the office and we will provide one for you.  Exercise: Exercise is an important part of staying healthy during your pregnancy.  You may continue most exercises you were  accustomed to prior to pregnancy.  Later in your pregnancy you will most likely notice you have difficulty with activities requiring balance like riding a bicycle.  It is important that you listen to your body and avoid activities that put you at a higher risk of falling.  Adequate rest and staying well hydrated are a must!  If you have questions about the safety of specific activities ask your provider.    Exposure to Children with illness: Try to avoid obvious exposure; report any symptoms to us when noted,  If you have chicken pos, red measles or mumps, you   should be immune to these diseases.   Please do not take any vaccines while pregnant unless you have checked with your OB provider.  Fetal Movement: After 28 weeks we recommend you do "kick counts" twice daily.  Lie or sit down in a calm quiet environment and count your baby movements "kicks".  You should feel your baby at least 10 times per hour.  If you have not felt 10 kicks within the first hour get up, walk around and have something sweet to eat or drink then repeat for an additional hour.  If count remains less than 10 per hour notify your provider.  Fumigating: Follow your pest control agent's advice as to how long to stay out of your home.  Ventilate the area well before re-entering.  Hemorrhoids:   Most over-the-counter preparations can be used during pregnancy.  Check your medication to see what is safe to use.  It is important to use a stool softener or fiber in your diet and to drink lots of liquids.  If hemorrhoids seem to be getting worse please call the office.   Hot Tubs:  Hot tubs Jacuzzis and saunas are not recommended while pregnant.  These increase your internal body temperature and should be avoided.  Intercourse:  Sexual intercourse is safe during pregnancy as long as you are comfortable, unless otherwise advised by your provider.  Spotting may occur after intercourse; report any bright red bleeding that is heavier than  spotting.  Labor:  If you know that you are in labor, please go to the hospital.  If you are unsure, please call the office and let us help you decide what to do.  Lifting, straining, etc:  If your job requires heavy lifting or straining please check with your provider for any limitations.  Generally, you should not lift items heavier than that you can lift simply with your hands and arms (no back muscles)  Painting:  Paint fumes do not harm your pregnancy, but may make you ill and should be avoided if possible.  Latex or water based paints have less odor than oils.  Use adequate ventilation while painting.  Permanents & Hair Color:  Chemicals in hair dyes are not recommended as they cause increase hair dryness which can increase hair loss during pregnancy.  " Highlighting" and permanents are allowed.  Dye may be absorbed differently and permanents may not hold as well during pregnancy.  Sunbathing:  Use a sunscreen, as skin burns easily during pregnancy.  Drink plenty of fluids; avoid over heating.  Tanning Beds:  Because their possible side effects are still unknown, tanning beds are not recommended.  Ultrasound Scans:  Routine ultrasounds are performed at approximately 20 weeks.  You will be able to see your baby's general anatomy an if you would like to know the gender this can usually be determined as well.  If it is questionable when you conceived you may also receive an ultrasound early in your pregnancy for dating purposes.  Otherwise ultrasound exams are not routinely performed unless there is a medical necessity.  Although you can request a scan we ask that you pay for it when conducted because insurance does not cover " patient request" scans.  Work: If your pregnancy proceeds without complications you may work until your due date, unless your physician or employer advises otherwise.  Round Ligament Pain/Pelvic Discomfort:  Sharp, shooting pains not associated with bleeding are fairly  common, usually occurring in the second trimester of pregnancy.  They   tend to be worse when standing up or when you remain standing for long periods of time.  These are the result of pressure of certain pelvic ligaments called "round ligaments".  Rest, Tylenol and heat seem to be the most effective relief.  As the womb and fetus grow, they rise out of the pelvis and the discomfort improves.  Please notify the office if your pain seems different than that described.  It may represent a more serious condition.  First Trimester of Pregnancy The first trimester of pregnancy is from week 1 until the end of week 12 (months 1 through 3). A week after a sperm fertilizes an egg, the egg will implant on the wall of the uterus. This embryo will begin to develop into a baby. Genes from you and your partner are forming the baby. The female genes determine whether the baby is a boy or a girl. At 6-8 weeks, the eyes and face are formed, and the heartbeat can be seen on ultrasound. At the end of 12 weeks, all the baby's organs are formed.  Now that you are pregnant, you will want to do everything you can to have a healthy baby. Two of the most important things are to get good prenatal care and to follow your health care provider's instructions. Prenatal care is all the medical care you receive before the baby's birth. This care will help prevent, find, and treat any problems during the pregnancy and childbirth. BODY CHANGES Your body goes through many changes during pregnancy. The changes vary from woman to woman.   You may gain or lose a couple of pounds at first.  You may feel sick to your stomach (nauseous) and throw up (vomit). If the vomiting is uncontrollable, call your health care provider.  You may tire easily.  You may develop headaches that can be relieved by medicines approved by your health care provider.  You may urinate more often. Painful urination may mean you have a bladder infection.  You may  develop heartburn as a result of your pregnancy.  You may develop constipation because certain hormones are causing the muscles that push waste through your intestines to slow down.  You may develop hemorrhoids or swollen, bulging veins (varicose veins).  Your breasts may begin to grow larger and become tender. Your nipples may stick out more, and the tissue that surrounds them (areola) may become darker.  Your gums may bleed and may be sensitive to brushing and flossing.  Dark spots or blotches (chloasma, mask of pregnancy) may develop on your face. This will likely fade after the baby is born.  Your menstrual periods will stop.  You may have a loss of appetite.  You may develop cravings for certain kinds of food.  You may have changes in your emotions from day to day, such as being excited to be pregnant or being concerned that something may go wrong with the pregnancy and baby.  You may have more vivid and strange dreams.  You may have changes in your hair. These can include thickening of your hair, rapid growth, and changes in texture. Some women also have hair loss during or after pregnancy, or hair that feels dry or thin. Your hair will most likely return to normal after your baby is born. WHAT TO EXPECT AT YOUR PRENATAL VISITS During a routine prenatal visit:  You will be weighed to make sure you and the baby are growing normally.  Your blood pressure will be taken.  Your  abdomen will be measured to track your baby's growth.  The fetal heartbeat will be listened to starting around week 10 or 12 of your pregnancy.  Test results from any previous visits will be discussed. Your health care provider may ask you:  How you are feeling.  If you are feeling the baby move.  If you have had any abnormal symptoms, such as leaking fluid, bleeding, severe headaches, or abdominal cramping.  If you are using any tobacco products, including cigarettes, chewing tobacco, and  electronic cigarettes.  If you have any questions. Other tests that may be performed during your first trimester include:  Blood tests to find your blood type and to check for the presence of any previous infections. They will also be used to check for low iron levels (anemia) and Rh antibodies. Later in the pregnancy, blood tests for diabetes will be done along with other tests if problems develop.  Urine tests to check for infections, diabetes, or protein in the urine.  An ultrasound to confirm the proper growth and development of the baby.  An amniocentesis to check for possible genetic problems.  Fetal screens for spina bifida and Down syndrome.  You may need other tests to make sure you and the baby are doing well.  HIV (human immunodeficiency virus) testing. Routine prenatal testing includes screening for HIV, unless you choose not to have this test. HOME CARE INSTRUCTIONS  Medicines  Follow your health care provider's instructions regarding medicine use. Specific medicines may be either safe or unsafe to take during pregnancy.  Take your prenatal vitamins as directed.  If you develop constipation, try taking a stool softener if your health care provider approves. Diet  Eat regular, well-balanced meals. Choose a variety of foods, such as meat or vegetable-based protein, fish, milk and low-fat dairy products, vegetables, fruits, and whole grain breads and cereals. Your health care provider will help you determine the amount of weight gain that is right for you.  Avoid raw meat and uncooked cheese. These carry germs that can cause birth defects in the baby.  Eating four or five small meals rather than three large meals a day may help relieve nausea and vomiting. If you start to feel nauseous, eating a few soda crackers can be helpful. Drinking liquids between meals instead of during meals also seems to help nausea and vomiting.  If you develop constipation, eat more high-fiber  foods, such as fresh vegetables or fruit and whole grains. Drink enough fluids to keep your urine clear or pale yellow. Activity and Exercise  Exercise only as directed by your health care provider. Exercising will help you:  Control your weight.  Stay in shape.  Be prepared for labor and delivery.  Experiencing pain or cramping in the lower abdomen or low back is a good sign that you should stop exercising. Check with your health care provider before continuing normal exercises.  Try to avoid standing for long periods of time. Move your legs often if you must stand in one place for a long time.  Avoid heavy lifting.  Wear low-heeled shoes, and practice good posture.  You may continue to have sex unless your health care provider directs you otherwise. Relief of Pain or Discomfort  Wear a good support bra for breast tenderness.   Take warm sitz baths to soothe any pain or discomfort caused by hemorrhoids. Use hemorrhoid cream if your health care provider approves.   Rest with your legs elevated if you have leg cramps  or low back pain.  If you develop varicose veins in your legs, wear support hose. Elevate your feet for 15 minutes, 3-4 times a day. Limit salt in your diet. Prenatal Care  Schedule your prenatal visits by the twelfth week of pregnancy. They are usually scheduled monthly at first, then more often in the last 2 months before delivery.  Write down your questions. Take them to your prenatal visits.  Keep all your prenatal visits as directed by your health care provider. Safety  Wear your seat belt at all times when driving.  Make a list of emergency phone numbers, including numbers for family, friends, the hospital, and police and fire departments. General Tips  Ask your health care provider for a referral to a local prenatal education class. Begin classes no later than at the beginning of month 6 of your pregnancy.  Ask for help if you have counseling or  nutritional needs during pregnancy. Your health care provider can offer advice or refer you to specialists for help with various needs.  Do not use hot tubs, steam rooms, or saunas.  Do not douche or use tampons or scented sanitary pads.  Do not cross your legs for long periods of time.  Avoid cat litter boxes and soil used by cats. These carry germs that can cause birth defects in the baby and possibly loss of the fetus by miscarriage or stillbirth.  Avoid all smoking, herbs, alcohol, and medicines not prescribed by your health care provider. Chemicals in these affect the formation and growth of the baby.  Do not use any tobacco products, including cigarettes, chewing tobacco, and electronic cigarettes. If you need help quitting, ask your health care provider. You may receive counseling support and other resources to help you quit.  Schedule a dentist appointment. At home, brush your teeth with a soft toothbrush and be gentle when you floss. SEEK MEDICAL CARE IF:   You have dizziness.  You have mild pelvic cramps, pelvic pressure, or nagging pain in the abdominal area.  You have persistent nausea, vomiting, or diarrhea.  You have a bad smelling vaginal discharge.  You have pain with urination.  You notice increased swelling in your face, hands, legs, or ankles. SEEK IMMEDIATE MEDICAL CARE IF:   You have a fever.  You are leaking fluid from your vagina.  You have spotting or bleeding from your vagina.  You have severe abdominal cramping or pain.  You have rapid weight gain or loss.  You vomit blood or material that looks like coffee grounds.  You are exposed to [place] measles and have never had them.  You are exposed to fifth disease or chickenpox.  You develop a severe headache.  You have shortness of breath.  You have any kind of trauma, such as from a fall or a car accident.   This information is not intended to replace advice given to you by your health care  provider. Make sure you discuss any questions you have with your health care provider.   Document Released: 10/01/2001 Document Revised: 10/28/2014 Document Reviewed: 08/17/2013 Elsevier Interactive Patient Education [employer]

## 2015-11-04 LAB — URINALYSIS, ROUTINE W REFLEX MICROSCOPIC
BILIRUBIN UA: NEGATIVE
GLUCOSE, UA: NEGATIVE
KETONES UA: NEGATIVE
NITRITE UA: NEGATIVE
RBC UA: NEGATIVE
UUROB: 0.2 mg/dL (ref 0.2–1.0)
pH, UA: 6 (ref 5.0–7.5)

## 2015-11-04 LAB — PAIN MGT SCRN (14 DRUGS), UR
AMPHETAMINE SCRN UR: NEGATIVE ng/mL
BARBITURATE SCRN UR: NEGATIVE ng/mL
BENZODIAZEPINE SCREEN, URINE: NEGATIVE ng/mL
Buprenorphine, Urine: NEGATIVE ng/mL
CANNABINOIDS UR QL SCN: NEGATIVE ng/mL
COCAINE(METAB.) SCREEN, URINE: NEGATIVE ng/mL
Creatinine(Crt), U: 140.5 mg/dL (ref 20.0–300.0)
Fentanyl, Urine: NEGATIVE pg/mL
Meperidine Screen, Urine: NEGATIVE ng/mL
Methadone Scn, Ur: NEGATIVE ng/mL
Opiate Scrn, Ur: NEGATIVE ng/mL
Oxycodone+Oxymorphone Ur Ql Scn: NEGATIVE ng/mL
PCP Scrn, Ur: NEGATIVE ng/mL
PH UR, DRUG SCRN: 5.9 (ref 4.5–8.9)
PROPOXYPHENE SCREEN: NEGATIVE ng/mL
TRAMADOL UR QL SCN: NEGATIVE ng/mL

## 2015-11-04 LAB — NICOTINE SCREEN, URINE: Cotinine Ql Scrn, Ur: NEGATIVE ng/mL

## 2015-11-04 LAB — MICROSCOPIC EXAMINATION: CASTS: NONE SEEN /LPF

## 2015-11-05 LAB — GC/CHLAMYDIA PROBE AMP
Chlamydia trachomatis, NAA: NEGATIVE
NEISSERIA GONORRHOEAE BY PCR: NEGATIVE

## 2015-11-06 LAB — CBC WITH DIFFERENTIAL/PLATELET
BASOS ABS: 0 10*3/uL (ref 0.0–0.2)
Basos: 0 %
EOS (ABSOLUTE): 0.1 10*3/uL (ref 0.0–0.4)
EOS: 0 %
HEMATOCRIT: 38.6 % (ref 34.0–46.6)
HEMOGLOBIN: 13.4 g/dL (ref 11.1–15.9)
IMMATURE GRANS (ABS): 0 10*3/uL (ref 0.0–0.1)
Immature Granulocytes: 0 %
LYMPHS ABS: 2.4 10*3/uL (ref 0.7–3.1)
Lymphs: 19 %
MCH: 32.3 pg (ref 26.6–33.0)
MCHC: 34.7 g/dL (ref 31.5–35.7)
MCV: 93 fL (ref 79–97)
MONOCYTES: 5 %
Monocytes Absolute: 0.6 10*3/uL (ref 0.1–0.9)
NEUTROS ABS: 9.6 10*3/uL — AB (ref 1.4–7.0)
NEUTROS PCT: 76 %
Platelets: 328 10*3/uL (ref 150–379)
RBC: 4.15 x10E6/uL (ref 3.77–5.28)
RDW: 13 % (ref 12.3–15.4)
WBC: 12.8 10*3/uL — ABNORMAL HIGH (ref 3.4–10.8)

## 2015-11-06 LAB — RPR: RPR: NONREACTIVE

## 2015-11-06 LAB — ANTIBODY SCREEN: Antibody Screen: NEGATIVE

## 2015-11-06 LAB — TOXOPLASMA ANTIBODIES- IGG AND  IGM: Toxoplasma Antibody- IgM: 4.1 AU/mL (ref 0.0–7.9)

## 2015-11-06 LAB — RH TYPE: Rh Factor: POSITIVE

## 2015-11-06 LAB — HIV ANTIBODY (ROUTINE TESTING W REFLEX): HIV SCREEN 4TH GENERATION: NONREACTIVE

## 2015-11-06 LAB — ABO

## 2015-11-06 LAB — RUBELLA ANTIBODY, IGM

## 2015-11-06 LAB — HEPATITIS B SURFACE ANTIGEN: HEP B S AG: NEGATIVE

## 2015-11-06 LAB — VARICELLA ZOSTER ANTIBODY, IGM: VARICELLA IGM: 1.83 {index} — AB (ref 0.00–0.90)

## 2015-11-09 ENCOUNTER — Encounter: Admitting: Obstetrics and Gynecology

## 2015-11-13 LAB — URINE CULTURE, OB REFLEX

## 2015-11-13 LAB — CULTURE, OB URINE

## 2015-11-21 ENCOUNTER — Ambulatory Visit (INDEPENDENT_AMBULATORY_CARE_PROVIDER_SITE_OTHER): Admitting: Obstetrics and Gynecology

## 2015-11-21 ENCOUNTER — Encounter: Payer: Self-pay | Admitting: Obstetrics and Gynecology

## 2015-11-21 VITALS — BP 141/84 | HR 105 | Wt 160.8 lb

## 2015-11-21 DIAGNOSIS — O09529 Supervision of elderly multigravida, unspecified trimester: Secondary | ICD-10-CM | POA: Insufficient documentation

## 2015-11-21 DIAGNOSIS — O09522 Supervision of elderly multigravida, second trimester: Secondary | ICD-10-CM

## 2015-11-21 DIAGNOSIS — O34219 Maternal care for unspecified type scar from previous cesarean delivery: Secondary | ICD-10-CM

## 2015-11-21 DIAGNOSIS — O10912 Unspecified pre-existing hypertension complicating pregnancy, second trimester: Secondary | ICD-10-CM

## 2015-11-21 DIAGNOSIS — I10 Essential (primary) hypertension: Secondary | ICD-10-CM

## 2015-11-21 DIAGNOSIS — Z3482 Encounter for supervision of other normal pregnancy, second trimester: Secondary | ICD-10-CM

## 2015-11-21 DIAGNOSIS — Z3492 Encounter for supervision of normal pregnancy, unspecified, second trimester: Secondary | ICD-10-CM

## 2015-11-21 DIAGNOSIS — F419 Anxiety disorder, unspecified: Secondary | ICD-10-CM | POA: Insufficient documentation

## 2015-11-21 LAB — POCT URINALYSIS DIPSTICK
Bilirubin, UA: NEGATIVE
GLUCOSE UA: NEGATIVE
Ketones, UA: NEGATIVE
Leukocytes, UA: NEGATIVE
NITRITE UA: NEGATIVE
PH UA: 6
Protein, UA: NEGATIVE
UROBILINOGEN UA: NEGATIVE

## 2015-11-21 NOTE — Progress Notes (Addendum)
OBSTETRIC INITIAL PRENATAL CARE VISIT  Subjective:    Michelle Owen is being seen today for her first obstetrical visit.  This is a planned pregnancy. She is a G2P1001 at [redacted]w[redacted]d gestation, with Estimated Date of Delivery: 05/21/16 by LMP 08/24/2015 (exact date). Her obstetrical history is significant for recent diagnosis of  HTN, h/o Cesarean section x 1 (for NRFHT). Relationship with FOB: spouse, living together. Patient does intend to breast feed. Pregnancy history fully reviewed.  Menstrual History: Obstetric History   G2   P1   T1   P0   A0   TAB0   SAB0   E0   M0   L1     # Outcome Date GA Lbr Len/2nd Weight Sex Delivery Anes PTL Lv  2 Current           1 Term 03/29/03 [redacted]w[redacted]d  7 lb (3.175 kg) M CS-Unspec  N Y    Obstetric Comments  C/S-fetal distress, nuchal cord    Menarche age: 76 Patient's last menstrual period was 08/24/2015 (exact date).  Last pap smear was 3 years ago.  Denies h/o STIs.   Past Medical History  Diagnosis Date  . Anxiety   . Hypertension     Past Surgical History  Procedure Laterality Date  . Cesarean section      Family History  Problem Relation Age of Onset  . Diabetes Mother   . Diabetes Father   . Heart disease Father   . Cancer Neg Hx     Social History   Social History  . Marital Status: Married    Spouse Name: N/A  . Number of Children: N/A  . Years of Education: N/A   Occupational History  . pharmacy tech [employer]   Social History Main Topics  . Smoking status: Former Smoker    Quit date: 09/02/2015  . Smokeless tobacco: Never Used  . Alcohol Use: No  . Drug Use: No  . Sexual Activity:    Partners: Male   Other Topics Concern  . Not on file   Social History Narrative    Current Outpatient Prescriptions on File Prior to Visit  Medication Sig Dispense Refill  . labetalol (NORMODYNE) 100 MG tablet Take 1 tablet (100 mg total) by mouth 2 (two) times daily. 60 tablet 1  . Prenatal Vit-Fe Fumarate-FA (PRENATAL  MULTIVITAMIN) TABS tablet Take 1 tablet by mouth daily at 12 noon.     No current facility-administered medications on file prior to visit.    No Known Allergies  Review of Systems General:Not Present- Fever, Weight Loss and Weight Gain. Skin:Not Present- Rash. HEENT:Not Present- Blurred Vision, Headache and Bleeding Gums. Respiratory:Not Present- Difficulty Breathing. Breast:Not Present- Breast Mass. Cardiovascular:Not Present- Chest Pain, Elevated Blood Pressure, Fainting / Blacking Out and Shortness of Breath. Gastrointestinal:Not Present- Abdominal Pain, Constipation, Nausea and Vomiting. Female Genitourinary:Not Present- Frequency, Painful Urination, Pelvic Pain, Vaginal Bleeding, Vaginal Discharge, Contractions, regular, Fetal Movements Decreased, Urinary Complaints and Vaginal Fluid. Musculoskeletal:Not Present- Back Pain and Leg Cramps. Neurological:Not Present- Dizziness. Psychiatric:Not Present- Depression.    Objective:    BP 141/84 mmHg  Pulse 105  Wt 160 lb 12.8 oz (72.938 kg)  LMP 08/24/2015 (Exact Date)    General Appearance:    Alert, cooperative, no distress, appears stated age  Head:    Normocephalic, without obvious abnormality, atraumatic  Eyes:    PERRL, conjunctiva/corneas clear, EOM's intact, both eyes  Ears:    Normal external ear canals, both ears  Nose:  Nares normal, septum midline, mucosa normal, no drainage or sinus tenderness  Throat:   Lips, mucosa, and tongue normal; teeth and gums normal  Neck:   Supple, symmetrical, trachea midline, no adenopathy; thyroid: no enlargement/tenderness/nodules; no carotid bruit or JVD  Back:     Symmetric, no curvature, ROM normal, no CVA tenderness  Lungs:     Clear to auscultation bilaterally, respirations unlabored  Chest Wall:    No tenderness or deformity   Heart:    Regular rate and rhythm, S1 and S2 normal, no murmur, rub or gallop  Breast Exam:    No tenderness, masses, or nipple abnormality    Abdomen:     Soft, non-tender, bowel sounds active all four quadrants, no masses, no organomegaly.  FH 14.  FHT 153  bpm.  Genitalia:    Pelvic:external genitalia normal, vagina with lesions, discharge, or tenderness, rectovaginal septum  normal. Cervix normal in appearance, no cervical motion tenderness, no adnexal masses or tenderness.  Pregnancy positive findings: uterine enlargement: 14 wk size, nontender.   Rectal:    Normal external sphincter.  No hemorrhoids appreciated. Internal exam not done.   Extremities:   Extremities normal, atraumatic, no cyanosis or edema  Pulses:   2+ and symmetric all extremities  Skin:   Skin color, texture, turgor normal, no rashes or lesions  Lymph nodes:   Cervical, supraclavicular, and axillary nodes normal  Neurologic:   CNII-XII intact, normal strength, sensation and reflexes throughout    Assessment:    Pregnancy at 14 and 0/7 weeks   cHTN (recently diagnosed) Advanced maternal age (patient will turn age 35 during the pregnancy) H/o C-section x 1 H/o anxiety  Plan:    Initial labs reviewed, normal. Prenatal vitamins encouraged. Problem list reviewed and updated. Pap smear done today.  AFP3 discussed: declined. Declines all genetic testing.  Role of ultrasound in pregnancy discussed; fetal survey: to be performed at appropriate gestational age. . Amniocentesis discussed: not indicated. To continue Labetalol 100 mg BID. Needs baseline PIH labs, EKG, and should begin aspirin 81 mg daily.  H/o of 1 prior C-section for NRFHT.  Discussed TOLAC vs repeat C-section, including risks/benefits.  Patient desires to think over options. To discuss at subsequent visits.  Patient with h/o anxiety, on no meds currently.  Without symptoms today.  Will continue to monitor.  Follow up in 4 weeks.  50% of 30 min visit spent on counseling and coordination of care.

## 2015-11-21 NOTE — Patient Instructions (Signed)
RE: MyChart  Dear Michelle Owen  We are excited to introduce MyChart, a new best-in-class service that provides you online access to important information in your electronic medical record. We want to make it easier for you to view your health information - all in one secure location - when and where you need it. We expect MyChart will enhance the quality of care and service we provide. Use the activation code below to enroll in MyChart online at [email or web address]  When you register for MyChart, you can:  Marland Kitchen View your test results. . Communicate securely with your physician's office.  . View your medical history, allergies, medications, and immunizations. . Conveniently print information such as your medication lists.  If you are age 32 or older and want a member of your family to have access to your record, you must provide written consent by completing a proxy form available at our facility. Please speak to our clinical staff about guidelines regarding accounts for patients younger than age 39.  As you activate your MyChart account and need any technical assistance, please call the MyChart technical support line at [phone]) or email your question to [email or web address]. If you email your question(s), please include your name, a return phone number and the best time to reach you.  Thank you for using MyChart as your new health and wellness resource!  MyChart Activation Code:  [email or web address] Expires: 11/24/2015  3:00 PM     [facility]  [address] [place], [place] [address]   Trial of Labor After Cesarean Delivery Information A trial of labor after cesarean delivery (TOLAC) is when a woman tries to give birth vaginally after a previous cesarean delivery. TOLAC may be a safe and appropriate option for you depending on your medical history and other risk factors. When TOLAC is successful and you are able to have a vaginal delivery, this is  called a vaginal birth after cesarean delivery (VBAC).  CANDIDATES FOR TOLAC TOLAC is possible for some women who:  Have undergone one or two prior cesarean deliveries in which the incision of the uterus was horizontal (low transverse).  Are carrying twins and have had one prior low transverse incision during a cesarean delivery.  Do not have a vertical (classical) uterine scar.  Have not had a tear in the wall of their uterus (uterine rupture). TOLAC is also supported for women who meet appropriate criteria and:  Are under the age of 40 years.  Are tall and have a body mass index (BMI) of less than 30.  Have an unknown uterine scar.  Give birth in a facility equipped to handle an emergency cesarean delivery. This team should be able to handle possible complications such as a uterine rupture.  Have thorough counseling about the benefits and risks of TOLAC.  Have discussed future pregnancy plans with their health care provider.  Plan to have several more pregnancies. MOST SUCCESSFUL CANDIDATES FOR TOLAC:  Have had a successful vaginal delivery before or after their cesarean delivery.  Experience labor that begins naturally on or before the due date (40 weeks of gestation).  Do not have a very large (macrosomic) baby.   Had a prior cesarean delivery but are not currently experiencing factors that would prompt a cesarean delivery (such as a breech position).  Had only one prior cesarean delivery.  Had a prior cesarean delivery that was performed early in labor and not after full cervical dilation. TOLAC may be most appropriate for women who  meet the above guidelines and who plan to have more pregnancies. TOLAC is not recommended for home births. LEAST SUCCESSFUL CANDIDATES FOR TOLAC:  Have an induced labor with an unfavorable cervix. An unfavorable cervix is when the cervix is not dilating enough (among other factors).  Have never had a vaginal delivery.  Have had more  than two cesarean deliveries.  Have a pregnancy at more than 40 weeks of gestation.  Are pregnant with a baby with a suspected weight greater than 4,000 grams (8 pounds) and who have no prior history of a vaginal delivery.  Have closely spaced pregnancies. SUGGESTED BENEFITS OF TOLAC  You may have a faster recovery time.  You may have a shorter stay in the hospital.  You may have less pain and fewer problems than with a cesarean delivery. Women who have a cesarean delivery have a higher chance of needing blood or getting a fever, an infection, or a blood clot in the legs. SUGGESTED RISKS OF TOLAC The highest risk of complications happens to women who attempt a TOLAC and fail. A failed TOLAC results in an unplanned cesarean delivery. Risks related to [facility] or repeat cesarean deliveries include:   Blood loss.  Infection.  Blood clot.  Injury to surrounding tissues or organs.  Having to remove the uterus (hysterectomy).  Potential problems with the placenta (such as placenta previa or placenta accreta) in future pregnancies. Although very rare, the main concerns with TOLAC are:  Rupture of the uterine scar from a past cesarean delivery.  Needing an emergency cesarean delivery.  Having a bad outcome for the baby (perinatal morbidity). FOR MORE INFORMATION American Congress of Obstetricians and Gynecologists: www.acog.org [employer]-Mid[employer]: www.midwife.org   This information is not intended to replace advice given to you by your health care provider. Make sure you discuss any questions you have with your health care provider.   Document Released: 06/25/2011 Document Revised: 07/28/2013 Document Reviewed: 03/29/2013 Elsevier Interactive Patient Education [employer]   Cesarean Delivery Cesarean delivery is the birth of a baby through a cut (incision) in the abdomen and womb (uterus).  LET [facility] CARE PROVIDER KNOW ABOUT:  All medicines you  are taking, including vitamins, herbs, eye drops, creams, and over-the-counter medicines.  Previous problems you or members of your family have had with the use of anesthetics.  Any bleeding or blood clotting disorders you have.  Family history of blood clots or bleeding disorders.  Any history of deep vein thrombosis (DVT) or pulmonary embolism (PE).  Previous surgeries you have had.  Medical conditions you have.  Any allergies you have.  Complicationsinvolving the pregnancy. RISKS AND COMPLICATIONS  Generally, this is a safe procedure. However, as with any procedure, complications can occur. Possible complications include:  Bleeding.  Infection.  Blood clots.  Injury to surrounding organs.  Problems with anesthesia.  Injury to the baby. BEFORE THE PROCEDURE   You may be given an antacid medicine to drink. This will prevent acid contents in your stomach from going into your lungs if you vomit during the surgery.  You may be given an antibiotic medicine to prevent infection. PROCEDURE   To prevent infection of your incision:  Hair may be removed from your pubic area if it is near your incision.  The skin of your pubic area and lower abdomen will be cleaned with a germ-killing solution (antiseptic).  A tube (Foley catheter) will be placed in your bladder to drain your urine from your bladder into  a bag. This keeps your bladder empty during surgery.  An IV tube will be placed in your vein.  You may be given medicine to numb the lower half of your body (regional anesthetic). If you were in labor, you may have already had an epidural in place which can be used in both labor and cesarean delivery. You may possibly be given medicine to make you sleep (general anesthetic) though this is not as common.  Your heart rate and your baby's heart rate will be monitored.  An incision will be made in your abdomen that extends to your uterus. There are 2 basic kinds of  incisions:  The horizontal (transverse) incision. Horizontal incisions are from side to side and are used for most routine cesarean deliveries.  The vertical incision. The vertical incision is from the top of the abdomen to the bottom and is less commonly used. It is often done for women who have a serious complication (extreme prematurity) or under emergency situations.  The horizontal and vertical incisions may both be used at the same time. However, this is very uncommon.  An incision is then made in your uterus to deliver the baby.  Your baby will be delivered.  Your health care provider may place the baby on your chest. It is important to keep the baby warm. Your health care provider will dry off the baby, place the baby directly on your bare skin, and cover the baby with warm, dry blankets.  Both incisions will be closed with absorbable stitches. AFTER THE PROCEDURE   If you were awake during the surgery, you will see your baby right away. If you were asleep, you will see your baby as soon as you are awake.  You may breastfeed your baby after surgery.  You may be able to get up and walk the same day as the surgery. If you need to stay in bed for a period of time, you will receive help to turn, cough, and take deep breaths after surgery. This helps prevent lung problems such as pneumonia.  Do not get out of bed alone the first time after surgery. You will need help getting out of bed until you are able to do this by yourself.  You may be able to shower the day after your cesarean delivery. After the bandage (dressing) is taken off the incision site, a nurse will assist you to shower if you would like help.  You may be directed to take actions to help prevent blood clots in your legs. These may include:  Walking shortly after surgery, with someone assisting you. Moving around after surgery helps to improve blood flow.  Wearing compression stockings or using different types of  devices.  Taking medicines to thin your blood (anticoagulants) if you are at high risk for DVT or PE.  Save any blood clots that you pass from your vagina. If you pass a clot while on the toilet, do not flush it. Call for the nurse. Tell the nurse if you think you are bleeding too much or passing too many clots.  You will be given medicine for pain and nausea as needed. Let your health care providers know if you are hurting. You may also be given an antibiotic to prevent an infection.  Your IV tube will be taken out when you are drinking a reasonable amount of fluids. The Foley catheter is taken out when you are up and walking.  If your blood type is Rh negative and your  baby's blood type is Rh positive, you will be given a shot of anti-D immune globulin. This shot prevents you from having Rh problems with a future pregnancy. You should get the shot even if you had your tubes tied (tubal ligation).  If you are allowed to take the baby for a walk, place the baby in the bassinet and push it.   This information is not intended to replace advice given to you by your health care provider. Make sure you discuss any questions you have with your health care provider.   Document Released: 10/07/2005 Document Revised: 06/28/2015 Document Reviewed: 06/03/2012 Elsevier Interactive Patient Education [employer]

## 2015-11-22 NOTE — Addendum Note (Signed)
Addended by: Frankey Shown on: 11/22/2015 08:39 AM   Modules accepted: Orders

## 2015-11-29 LAB — PAP IG AND HPV HIGH-RISK
HPV, HIGH-RISK: NEGATIVE
PAP SMEAR COMMENT: 0

## 2015-11-30 ENCOUNTER — Telehealth: Payer: Self-pay

## 2015-11-30 DIAGNOSIS — B379 Candidiasis, unspecified: Secondary | ICD-10-CM

## 2015-11-30 MED ORDER — FLUCONAZOLE 150 MG PO TABS: 150.0000 mg | ORAL_TABLET | Freq: Once | ORAL | Status: DC

## 2015-11-30 NOTE — Telephone Encounter (Signed)
Pt informed, RX sent in.

## 2015-11-30 NOTE — Telephone Encounter (Signed)
-----   Message from Hildred Laser, MD sent at 11/29/2015  2:28 PM EST ----- Please inform patient of negative pap smear.  However yeast noted on pap, can treat with tablet of Diflucan 150 mg.

## 2015-12-19 ENCOUNTER — Ambulatory Visit (INDEPENDENT_AMBULATORY_CARE_PROVIDER_SITE_OTHER): Admitting: Obstetrics and Gynecology

## 2015-12-19 ENCOUNTER — Encounter: Payer: Self-pay | Admitting: Obstetrics and Gynecology

## 2015-12-19 VITALS — BP 122/78 | HR 93 | Wt 166.5 lb

## 2015-12-19 DIAGNOSIS — O34219 Maternal care for unspecified type scar from previous cesarean delivery: Secondary | ICD-10-CM

## 2015-12-19 DIAGNOSIS — N949 Unspecified condition associated with female genital organs and menstrual cycle: Secondary | ICD-10-CM

## 2015-12-19 DIAGNOSIS — O09522 Supervision of elderly multigravida, second trimester: Secondary | ICD-10-CM

## 2015-12-19 DIAGNOSIS — O162 Unspecified maternal hypertension, second trimester: Secondary | ICD-10-CM

## 2015-12-19 LAB — POCT URINALYSIS DIPSTICK
Bilirubin, UA: NEGATIVE
Blood, UA: NEGATIVE
Glucose, UA: NEGATIVE
KETONES UA: NEGATIVE
LEUKOCYTES UA: NEGATIVE
NITRITE UA: NEGATIVE
PH UA: 6
Spec Grav, UA: >=1.03
UROBILINOGEN UA: NEGATIVE

## 2015-12-19 MED ORDER — ASPIRIN EC 81 MG PO TBEC: 81.0000 mg | DELAYED_RELEASE_TABLET | Freq: Every day | ORAL | Status: DC

## 2015-12-19 NOTE — Progress Notes (Signed)
ROB: Patient complains of bilateral groin pain and cramping. Denies vaginal bleeding.  DIscussed TWG for pregnancy so far at 21 lbs.  Patient notes that she was eating a lot of carbs during 1st trimester and early 2nd trimester for management of nausea/vomiting.  Discussed dietary modifications, exercise in pregnancy. Advised on limiting further weight gain until later into 2nd trimester/early 3rd trimester. Reiterated initiation of daily baby aspirin for pre-elcampsia prevention. Patient has decided on repeat C-section as mode of delivery.  RTC in 2 weeks for anatomy scan, 4 weeks for OB visit.

## 2016-01-02 ENCOUNTER — Ambulatory Visit (INDEPENDENT_AMBULATORY_CARE_PROVIDER_SITE_OTHER)

## 2016-01-02 DIAGNOSIS — O09522 Supervision of elderly multigravida, second trimester: Secondary | ICD-10-CM | POA: Diagnosis not present

## 2016-01-04 ENCOUNTER — Telehealth: Payer: Self-pay | Admitting: Obstetrics and Gynecology

## 2016-01-04 DIAGNOSIS — B379 Candidiasis, unspecified: Secondary | ICD-10-CM

## 2016-01-04 MED ORDER — FLUCONAZOLE 150 MG PO TABS
150.0000 mg | ORAL_TABLET | Freq: Once | ORAL | Status: DC
Start: 2016-01-04 — End: 2016-01-17

## 2016-01-04 NOTE — Telephone Encounter (Signed)
PT CALLED AND SHE HAD A YEAST INFECTION A FEW WEEKS AGO WHEN SHE CAME IN HER AND DR CHERRY GAVE HER FLUCONAZOLE, JUST A ONE TIME PILL AND NOW SHE THINKS SHE IS GETTING ANOTHER ONE, SHE IS HAVING THE DISCHARGE AND THE ODOR AND ITCHING, SO SHE WANTED TO KNOW IF A MEDICATION CAN BE CALLED IN FOR HER OR DOES SHE NEED TO BE SEEN, SHE IS [redacted] WEEKS PREGNANT.

## 2016-01-04 NOTE — Telephone Encounter (Signed)
Another rx for diflucan has been sent in, if no improvement pt will need to be seen. Pt informed.

## 2016-01-05 ENCOUNTER — Other Ambulatory Visit

## 2016-01-08 ENCOUNTER — Other Ambulatory Visit: Payer: Self-pay | Admitting: Obstetrics and Gynecology

## 2016-01-08 DIAGNOSIS — Z0489 Encounter for examination and observation for other specified reasons: Secondary | ICD-10-CM

## 2016-01-08 DIAGNOSIS — IMO0002 Reserved for concepts with insufficient information to code with codable children: Secondary | ICD-10-CM

## 2016-01-11 ENCOUNTER — Ambulatory Visit (INDEPENDENT_AMBULATORY_CARE_PROVIDER_SITE_OTHER)

## 2016-01-11 DIAGNOSIS — Z36 Encounter for antenatal screening of mother: Secondary | ICD-10-CM

## 2016-01-11 DIAGNOSIS — Z0489 Encounter for examination and observation for other specified reasons: Secondary | ICD-10-CM

## 2016-01-11 DIAGNOSIS — IMO0002 Reserved for concepts with insufficient information to code with codable children: Secondary | ICD-10-CM

## 2016-01-15 ENCOUNTER — Observation Stay
Admission: EM | Admit: 2016-01-15 | Discharge: 2016-01-15 | Disposition: A | Discharge status: Home / Self Care | Attending: Obstetrics and Gynecology | Admitting: Obstetrics and Gynecology

## 2016-01-15 ENCOUNTER — Encounter: Payer: Self-pay | Admitting: Emergency Medicine

## 2016-01-15 ENCOUNTER — Emergency Department

## 2016-01-15 DIAGNOSIS — M7989 Other specified soft tissue disorders: Secondary | ICD-10-CM | POA: Diagnosis not present

## 2016-01-15 DIAGNOSIS — O4702 False labor before 37 completed weeks of gestation, second trimester: Secondary | ICD-10-CM

## 2016-01-15 DIAGNOSIS — I493 Ventricular premature depolarization: Secondary | ICD-10-CM

## 2016-01-15 DIAGNOSIS — O99412 Diseases of the circulatory system complicating pregnancy, second trimester: Secondary | ICD-10-CM | POA: Diagnosis not present

## 2016-01-15 DIAGNOSIS — Z3A22 22 weeks gestation of pregnancy: Secondary | ICD-10-CM | POA: Insufficient documentation

## 2016-01-15 DIAGNOSIS — O9989 Other specified diseases and conditions complicating pregnancy, childbirth and the puerperium: Secondary | ICD-10-CM | POA: Insufficient documentation

## 2016-01-15 DIAGNOSIS — Z87891 Personal history of nicotine dependence: Secondary | ICD-10-CM | POA: Insufficient documentation

## 2016-01-15 DIAGNOSIS — R079 Chest pain, unspecified: Secondary | ICD-10-CM

## 2016-01-15 DIAGNOSIS — R55 Syncope and collapse: Secondary | ICD-10-CM | POA: Diagnosis not present

## 2016-01-15 DIAGNOSIS — O162 Unspecified maternal hypertension, second trimester: Secondary | ICD-10-CM | POA: Diagnosis not present

## 2016-01-15 DIAGNOSIS — F419 Anxiety disorder, unspecified: Secondary | ICD-10-CM | POA: Diagnosis not present

## 2016-01-15 DIAGNOSIS — R0602 Shortness of breath: Secondary | ICD-10-CM

## 2016-01-15 DIAGNOSIS — Z7982 Long term (current) use of aspirin: Secondary | ICD-10-CM | POA: Diagnosis not present

## 2016-01-15 DIAGNOSIS — I491 Atrial premature depolarization: Secondary | ICD-10-CM | POA: Diagnosis not present

## 2016-01-15 DIAGNOSIS — R0600 Dyspnea, unspecified: Secondary | ICD-10-CM

## 2016-01-15 DIAGNOSIS — R000 Tachycardia, unspecified: Secondary | ICD-10-CM | POA: Insufficient documentation

## 2016-01-15 DIAGNOSIS — N390 Urinary tract infection, site not specified: Secondary | ICD-10-CM

## 2016-01-15 DIAGNOSIS — R824 Acetonuria: Secondary | ICD-10-CM | POA: Insufficient documentation

## 2016-01-15 DIAGNOSIS — O99342 Other mental disorders complicating pregnancy, second trimester: Secondary | ICD-10-CM | POA: Insufficient documentation

## 2016-01-15 LAB — CBC
HEMATOCRIT: 38.9 % (ref 35.0–47.0)
HEMOGLOBIN: 13.5 g/dL (ref 12.0–16.0)
MCH: 32.6 pg (ref 26.0–34.0)
MCHC: 34.8 g/dL (ref 32.0–36.0)
MCV: 93.8 fL (ref 80.0–100.0)
Platelets: 340 10*3/uL (ref 150–440)
RBC: 4.15 MIL/uL (ref 3.80–5.20)
RDW: 12.6 % (ref 11.5–14.5)
WBC: 19.8 10*3/uL — ABNORMAL HIGH (ref 3.6–11.0)

## 2016-01-15 LAB — TROPONIN I: Troponin I: <0.03 ng/mL (ref <0.031)

## 2016-01-15 LAB — URINALYSIS COMPLETE WITH MICROSCOPIC (ARMC ONLY)
Bilirubin Urine: NEGATIVE
Glucose, UA: NEGATIVE mg/dL
Hgb urine dipstick: NEGATIVE
Nitrite: NEGATIVE
PH: 5 (ref 5.0–8.0)
PROTEIN: 30 mg/dL — AB
Specific Gravity, Urine: 1.028 (ref 1.005–1.030)

## 2016-01-15 LAB — BASIC METABOLIC PANEL
Anion gap: 9 (ref 5–15)
BUN: 14 mg/dL (ref 6–20)
CO2: 17 mmol/L — AB (ref 22–32)
Calcium: 8.9 mg/dL (ref 8.9–10.3)
Chloride: 105 mmol/L (ref 101–111)
Creatinine, Ser: 0.45 mg/dL (ref 0.44–1.00)
GFR calc Af Amer: >60 mL/min (ref >60)
GLUCOSE: 87 mg/dL (ref 65–99)
POTASSIUM: 3.6 mmol/L (ref 3.5–5.1)
Sodium: 131 mmol/L — ABNORMAL LOW (ref 135–145)

## 2016-01-15 LAB — MAGNESIUM: MAGNESIUM: 2 mg/dL (ref 1.7–2.4)

## 2016-01-15 NOTE — Discharge Instructions (Signed)
Rest and stay hydrated. Dr. Oretha Milchherry's office will contact you regarding a cardiology consult. Call your doctor or return to the ER with questions, concerns, or worsening symptoms.  Premature Ventricular Contraction A premature ventricular contraction is an irregularity in the normal heart rhythm. These contractions are extra heartbeats that occur too early in the normal sequence. In most cases, these contractions are harmless and do not require treatment. CAUSES Premature ventricular contractions may occur without a known cause. In healthy people, the extra contractions may be caused by:  Smoking.  Drinking alcohol.  Caffeine.  Certain medicines.  Some illegal drugs.  Stress. Sometimes, changes in chemicals in the blood (electrolytes) can also cause premature ventricular contractions. They can also occur in people with heart diseases that cause a decrease in blood flow to the heart. SIGNS AND SYMPTOMS Premature ventricular contractions often do not cause any symptoms. In some cases, you may have a feeling of your heart beating fast or skipping a beat (palpitations). DIAGNOSIS Your health care provider will take your medical history and do a physical exam. During the exam, the health care provider will check for irregular heartbeats. Various tests may be done to help diagnose premature ventricular contractions. These tests may include:  An ECG (electrocardiogram) to monitor the electrical activity of your heart.  Holter monitor testing. A Holter monitor is a portable device that can monitor the electrical activity of your heart over longer periods of time.  Stress tests to see how exercise affects your heart rhythm.  Echocardiogram. This test uses sound waves (ultrasound) to produce an image of your heart.  Electrophysiology study. This is used to evaluate the electrical conduction system of your heart. TREATMENT Usually, no treatment is needed. You may be advised to avoid things that  can trigger the premature contractions, such as caffeine or alcohol. Medicines are sometimes given if symptoms are severe or if the extra heartbeats are very frequent. Treatment may also be needed for an underlying cause of the contractions if one is found. HOME CARE INSTRUCTIONS  Take medicines only as directed by your health care provider.  Make any lifestyle changes recommended by your health care provider. These may include:  Quitting smoking.  Avoiding or limiting caffeine or alcohol.  Exercising. Talk to your health care provider about what type of exercise is safe for you.  Trying to reduce stress.  Keep all follow-up visits with your health care provider. This is important. SEEK IMMEDIATE MEDICAL CARE IF:  You feel palpitations that are frequent or continual.  You have chest pain.  You have shortness of breath.  You have sweating for no reason.  You have nausea and vomiting.  You become light-headed or faint.   This information is not intended to replace advice given to you by your health care provider. Make sure you discuss any questions you have with your health care provider.   Document Released: 05/24/2004 Document Revised: 10/28/2014 Document Reviewed: 03/10/2014 Elsevier Interactive Patient Education 2016 [employer]   Abdominal Pain During Pregnancy Belly (abdominal) pain is common during pregnancy. Most of the time, it is not a serious problem. Other times, it can be a sign that something is wrong with the pregnancy. Always tell your doctor if you have belly pain. HOME CARE Monitor your belly pain for any changes. The following actions may help you feel better:  Do not have sex (intercourse) or put anything in your vagina until you feel better.  Rest until your pain stops.  Drink clear fluids  if you feel sick to your stomach (nauseous). Do not eat solid food until you feel better.  Only take medicine as told by your doctor.  Keep all doctor visits  as told. GET HELP RIGHT AWAY IF:   You are bleeding, leaking fluid, or pieces of tissue come out of your vagina.  You have more pain or cramping.  You keep throwing up (vomiting).  You have pain when you pee (urinate) or have blood in your pee.  You have a fever.  You do not feel your baby moving as much.  You feel very weak or feel like passing out.  You have trouble breathing, with or without belly pain.  You have a very bad headache and belly pain.  You have fluid leaking from your vagina and belly pain.  You keep having watery poop (diarrhea).  Your belly pain does not go away after resting, or the pain gets worse. MAKE SURE YOU:   Understand these instructions.  Will watch your condition.  Will get help right away if you are not doing well or get worse.   This information is not intended to replace advice given to you by your health care provider. Make sure you discuss any questions you have with your health care provider.   Document Released: 09/25/2009 Document Revised: 06/09/2013 Document Reviewed: 05/06/2013 Elsevier Interactive Patient Education [employer]

## 2016-01-15 NOTE — ED Notes (Signed)
Pt to ed with c/o sob and tachycardia intermittently for the past 3 days. States today while at work her heart rate was 117 and she felt dizzy and sob.

## 2016-01-15 NOTE — ED Provider Notes (Addendum)
[facility] Emergency Department Provider Note  ____________________________________________  Time seen: Approximately 345 PM  I have reviewed the triage vital signs and the nursing notes.   HISTORY  Chief Complaint Shortness of Breath and Tachycardia    HPI Michelle Owen is a 35 y.o. female at [redacted] weeks gestation who is a G2 P1 who is presenting to the emergency department with 2 weeks of shortness of breath with exertion as well as palpitations. She says that she also, about a week ago, had sharp chest pains that lasted about a second each. They were central to the left side. She has not had any chest pain since. She has no pain with deep breathing. Denies any fever or cough. Says that after standing for most of the day she is beginning to have a lot of ankle swelling. The swelling does go down when she lays flat. Denies any shortness of breath or palpitations at this time while she is at rest in the stretcher. She said that she felt like she was going to pass out today at work while standing as well as having a racing heartbeat and a pressure in the 140s over 90s. She is taking labetalol and does have hypertension with this pregnancy. She is seen by Dr. Valentino Saxon. She says that she has also been having contractions. She says she has had 3-4 today and feels like a tightness to her lower abdomen. Denies any discharge or bleeding. Says that this contractions last for about 15 seconds area and says she has been drinking plenty of water.Says she has been compliant with her labetalol.   Past Medical History  Diagnosis Date  . Anxiety   . Hypertension     Patient Active Problem List   Diagnosis Date Noted  . Advanced maternal age in multigravida 11/21/2015  . Hypertension in pregnancy, pre-existing 11/21/2015  . H/O cesarean section complicating pregnancy 11/21/2015  . Anxiety 11/21/2015    Past Surgical History  Procedure Laterality Date  . Cesarean section       Current Outpatient Rx  Name  Route  Sig  Dispense  Refill  . aspirin EC 81 MG tablet   Oral   Take 1 tablet (81 mg total) by mouth daily. Take after 12 weeks for prevention of preeclampssia later in pregnancy   300 tablet   2   . fluconazole (DIFLUCAN) 150 MG tablet   Oral   Take 1 tablet (150 mg total) by mouth once.   1 tablet   0   . labetalol (NORMODYNE) 100 MG tablet   Oral   Take 1 tablet (100 mg total) by mouth 2 (two) times daily.   60 tablet   1   . Prenatal Vit-Fe Fumarate-FA (PRENATAL MULTIVITAMIN) TABS tablet   Oral   Take 1 tablet by mouth daily at 12 noon.           Allergies Review of patient's allergies indicates no known allergies.  Family History  Problem Relation Age of Onset  . Diabetes Mother   . Diabetes Father   . Heart disease Father   . Cancer Neg Hx     Social History Social History  Substance Use Topics  . Smoking status: Former Smoker    Quit date: 09/02/2015  . Smokeless tobacco: Never Used  . Alcohol Use: No    Review of Systems Constitutional: No fever/chills Eyes: No visual changes. ENT: No sore throat. Cardiovascular: As above Respiratory: As above Gastrointestinal:  No nausea, no  vomiting.  No diarrhea.  No constipation. Genitourinary: Negative for dysuria. Musculoskeletal: Negative for back pain. Skin: Negative for rash. Neurological: Negative for headaches, focal weakness or numbness.  10-point ROS otherwise negative.  ____________________________________________   PHYSICAL EXAM:  VITAL SIGNS: ED Triage Vitals  Enc Vitals Group     BP 01/15/16 1349 102/82 mmHg     Pulse Rate 01/15/16 1349 105     Resp 01/15/16 1349 20     Temp 01/15/16 1349 97.5 F (36.4 C)     Temp Source 01/15/16 1349 Oral     SpO2 01/15/16 1349 99 %     Weight 01/15/16 1349 166 lb (75.297 kg)     Height 01/15/16 1349  (1.702 m)     Head Cir --      Peak Flow --      Pain Score 01/15/16 1350 0     Pain Loc --      Pain  Edu? --      Excl. in GC? --     Constitutional: Alert and oriented. Well appearing and in no acute distress. Eyes: Conjunctivae are normal. PERRL. EOMI. Head: Atraumatic. Nose: No congestion/rhinnorhea. Mouth/Throat: Mucous membranes are moist.  Oropharynx non-erythematous. Neck: No stridor.   Cardiovascular: Normal rate, regular rhythm. Grossly normal heart sounds.  Good peripheral circulation. Respiratory: Normal respiratory effort.  No retractions. Lungs CTAB. Gastrointestinal: Soft and nontender with gravid uterus measured about 1 cm above the umbilicus. No CVA tenderness. Musculoskeletal: No lower extremity tenderness nor edema.  No joint effusions. Neurologic:  Normal speech and language. No gross focal neurologic deficits are appreciated.  Skin:  Skin is warm, dry and intact. No rash noted. Psychiatric: Mood and affect are normal. Speech and behavior are normal.  ____________________________________________   LABS (all labs ordered are listed, but only abnormal results are displayed)  Labs Reviewed  BASIC METABOLIC PANEL - Abnormal; Notable for the following:    Sodium 131 (*)    CO2 17 (*)    All other components within normal limits  CBC - Abnormal; Notable for the following:    WBC 19.8 (*)    All other components within normal limits  TROPONIN I  MAGNESIUM  URINALYSIS COMPLETEWITH MICROSCOPIC ([facility] ONLY)   ____________________________________________  EKG  ED ECG REPORT I, Arelia Longest, the attending physician, personally viewed and interpreted this ECG.   Date: 01/15/2016  EKG Time: 1356  Rate: 105  Rhythm: sinus tachycardia with PVCs and PACs.  Axis: Normal  Intervals:none  ST&T Change: No ST segment elevation or depression. No abnormal T-wave inversion.  ____________________________________________  RADIOLOGY  Discussed chest x-ray with the patient and she is refusing secondary to being  pregnant. ____________________________________________   PROCEDURES   ____________________________________________   INITIAL IMPRESSION / ASSESSMENT AND PLAN / ED COURSE  Pertinent labs & imaging results that were available during my care of the patient were reviewed by me and considered in my medical decision making (see chart for details).  ----------------------------------------- 4:14 PM on 01/15/2016 -----------------------------------------  Patient's heart rate in the 90s to low 100s while I was examining her. Denies any shortness of breath or chest pain at this time. Also without any edema this time. Pending her urinalysis. Blood pressure was in the 1 teens over 80s when I was at the bedside. Pending magnesium as well as hepatic function at this time. Unlikely to be pulmonary embolus. Patient without any shortness of breath or chest pain at rest. Reassuring EKG and troponin  for any ischemia. Discussed the case with Dr. Valentino Saxonherry who accepted the patient up to the labor and delivery floor. She will be dispositioned to labor and delivery floor for further observation and treatment. ____________________________________________   FINAL CLINICAL IMPRESSION(S) / ED DIAGNOSES   Near-syncope. Dyspnea. Chest pain. Contractions.   Myrna Blazeravid Matthew Schaevitz, MD 01/15/16 1615  Chest pain is not reproducible with palpation.  Myrna Blazeravid Matthew Schaevitz, MD 01/15/16 [id number]

## 2016-01-15 NOTE — Final Progress Note (Cosign Needed)
L&D OB Triage Note  Michelle Owen is a 35 y.o. G2P1001 female at 6819w1d, EDD Estimated Date of Delivery: 05/21/16 with h/o cHTN who presented to triage after evaluation in the Emergency Room for complaints of SOB and heart palpitations.  Was ruled out for acute cardiac or pulmonary dysfunction, however was noted to have mild tachycardia (100s) and PACs on EKG.  While in the Emergency Room she also complained of lower abdomen tightening (like contractions), intermittent, lasting 15 seconds.  She was evaluated by the Labor and Delivery nurses with no significant findings of uterine contractions. Vital signs stable. Patient continued additional cardiac monitoring with continued PACs noted and mild tachycardia.  Patient was no longer symptomatic. Fetal evaluation was performed and has been reviewed by MD.      Physical Exam:  Blood pressure 106/63, pulse 74, temperature 97.5 F (36.4 C), temperature source Oral, resp. rate 20, height 5\' 7"  (1.702 m), weight 166 lb (75.297 kg), last menstrual period 08/24/2015, SpO2 98 %. Cervix: deferred  NST INTERPRETATION: Indications: rule out uterine contractions  Mode: Doppler Baseline Rate (A): 150 bpm           Contraction Frequency (min): none    Labs:  Results for orders placed or performed during the hospital encounter of 01/15/16  Basic metabolic panel  Result Value Ref Range   Sodium 131 (L) 135 - 145 mmol/L   Potassium 3.6 3.5 - 5.1 mmol/L   Chloride 105 101 - 111 mmol/L   CO2 17 (L) 22 - 32 mmol/L   Glucose, Bld 87 65 - 99 mg/dL   BUN 14 6 - 20 mg/dL   Creatinine, Ser 9.600.45 0.44 - 1.00 mg/dL   Calcium 8.9 8.9 - 45.410.3 mg/dL   GFR calc non Af Amer >60 >60 mL/min   GFR calc Af Amer >60 >60 mL/min   Anion gap 9 5 - 15  Troponin I  Result Value Ref Range   Troponin I <0.03 <0.031 ng/mL  CBC  Result Value Ref Range   WBC 19.8 (H) 3.6 - 11.0 K/uL   RBC 4.15 3.80 - 5.20 MIL/uL   Hemoglobin 13.5 12.0 - 16.0 g/dL   HCT 09.838.9 11.935.0 -  14.747.0 %   MCV 93.8 80.0 - 100.0 fL   MCH 32.6 26.0 - 34.0 pg   MCHC 34.8 32.0 - 36.0 g/dL   RDW 82.912.6 56.211.5 - 13.014.5 %   Platelets 340 150 - 440 K/uL  Urinalysis complete, with microscopic ([facility] only)  Result Value Ref Range   Color, Urine YELLOW (A) YELLOW   APPearance HAZY (A) CLEAR   Glucose, UA NEGATIVE NEGATIVE mg/dL   Bilirubin Urine NEGATIVE NEGATIVE   Ketones, ur 2+ (A) NEGATIVE mg/dL   Specific Gravity, Urine 1.028 1.005 - 1.030   Hgb urine dipstick NEGATIVE NEGATIVE   pH 5.0 5.0 - 8.0   Protein, ur 30 (A) NEGATIVE mg/dL   Nitrite NEGATIVE NEGATIVE   Leukocytes, UA 3+ (A) NEGATIVE   RBC / HPF 6-30 0 - 5 RBC/hpf   WBC, UA 6-30 0 - 5 WBC/hpf   Bacteria, UA RARE (A) NONE SEEN   Squamous Epithelial / LPF 0-5 (A) NONE SEEN   Mucous PRESENT   Magnesium  Result Value Ref Range   Magnesium 2.0 1.7 - 2.4 mg/dL     EKG: Sinus tachycardia (rate 105) with premature atrial complexes.  Nonspecific ST abnormalities.   Assessment:  1. Premature atrial complexes with sinus tachycardia 2. Ketonuria 3. Large Leukocytosis with  WBCs and RBCs in urine, suspicious for UTI.   Plan:  1. Fetal status reassuring.  2. Encouraged on adequate hydration.   3. Will be followed up outpatient with Cardiology for cardiac PACs noted on EKG.   4.  Will treat empirically for UTI.  Can take Tylenol for cramping.  5.  Discharge home. Continue routine prenatal care. Follow up with OB/GYN as previously scheduled. Return if symptoms worsen.     Hildred Laser, MD  [facility]

## 2016-01-15 NOTE — OB Triage Note (Signed)
Patient sent from ER, complaining of chest pain, shortness of breath, heart racing times 3 days, and cramping times 2 weeks. Pt cleared in the ER.

## 2016-01-16 ENCOUNTER — Encounter: Admitting: Obstetrics and Gynecology

## 2016-01-17 ENCOUNTER — Ambulatory Visit (INDEPENDENT_AMBULATORY_CARE_PROVIDER_SITE_OTHER): Admitting: Obstetrics and Gynecology

## 2016-01-17 ENCOUNTER — Encounter: Payer: Self-pay | Admitting: Obstetrics and Gynecology

## 2016-01-17 VITALS — BP 122/81 | HR 105 | Wt 170.2 lb

## 2016-01-17 DIAGNOSIS — N390 Urinary tract infection, site not specified: Secondary | ICD-10-CM

## 2016-01-17 DIAGNOSIS — O09522 Supervision of elderly multigravida, second trimester: Secondary | ICD-10-CM

## 2016-01-17 DIAGNOSIS — O10919 Unspecified pre-existing hypertension complicating pregnancy, unspecified trimester: Secondary | ICD-10-CM

## 2016-01-17 DIAGNOSIS — Z3492 Encounter for supervision of normal pregnancy, unspecified, second trimester: Secondary | ICD-10-CM

## 2016-01-17 DIAGNOSIS — R000 Tachycardia, unspecified: Secondary | ICD-10-CM

## 2016-01-17 DIAGNOSIS — O10019 Pre-existing essential hypertension complicating pregnancy, unspecified trimester: Secondary | ICD-10-CM

## 2016-01-17 LAB — POCT URINALYSIS DIPSTICK
Bilirubin, UA: NEGATIVE
GLUCOSE UA: NEGATIVE
KETONES UA: 5
Nitrite, UA: NEGATIVE
Protein, UA: NEGATIVE
Urobilinogen, UA: NEGATIVE
pH, UA: 8

## 2016-01-21 NOTE — Progress Notes (Signed)
ROB: Patient seen in triage 2 days ago for SOB and heart palpitations.  EKG noted to have recurrent PACs.  Has consultation with Cardiology scheduled in next 1-2 weeks.  Normal anatomy scan performed.  RTC in 4 weeks.

## 2016-01-30 ENCOUNTER — Encounter: Payer: Self-pay | Admitting: *Deleted

## 2016-01-30 ENCOUNTER — Ambulatory Visit (INDEPENDENT_AMBULATORY_CARE_PROVIDER_SITE_OTHER): Admitting: Cardiology

## 2016-01-30 ENCOUNTER — Encounter (INDEPENDENT_AMBULATORY_CARE_PROVIDER_SITE_OTHER): Payer: Self-pay

## 2016-01-30 ENCOUNTER — Encounter: Payer: Self-pay | Admitting: Cardiology

## 2016-01-30 VITALS — BP 110/78 | HR 94 | Ht 66.5 in | Wt 172.5 lb

## 2016-01-30 DIAGNOSIS — I491 Atrial premature depolarization: Secondary | ICD-10-CM

## 2016-01-30 DIAGNOSIS — R600 Localized edema: Secondary | ICD-10-CM

## 2016-01-30 DIAGNOSIS — R002 Palpitations: Secondary | ICD-10-CM

## 2016-01-30 DIAGNOSIS — R000 Tachycardia, unspecified: Secondary | ICD-10-CM | POA: Diagnosis not present

## 2016-01-30 DIAGNOSIS — I493 Ventricular premature depolarization: Secondary | ICD-10-CM

## 2016-01-30 NOTE — Progress Notes (Signed)
Cardiology Office Note   Date:  01/30/2016   ID:  Michelle Owen, DOB Jun 02, 1981, MRN [id number]  Referring Doctor:  PROVIDER NOT IN SYSTEM   Cardiologist:   Almond LintAileen Heydy Montilla, MD   Reason for consultation:  Chief Complaint  Patient presents with  . other    PVC's. Meds reviewed verbally with pt.      History of Present Illness: Michelle Cosslizabeth Owen is a 35 y.o. female who presents for Palpitations. 3 weeks ago, while at work, patient had the sudden onset of flip-flopping sensation in her chest/palpitations. Symptoms were mainly located in the chest, nonradiating. Severity was moderate. Episodes lasted minutes at a time. Spontaneously resolving. She got worried about this and started to feel shortness of breath. Because this was a new symptom for her, she went to her doctor and she was advised to go to the ER for further evaluation. She had no chest pain during this time. Patient is currently in her sixth month of pregnancy.  At other times, she would have no chest pain or shortness of breath. She would've swelling in her feet at the end of the day, after prolonged standing.  First pregnancy, she had no issues with palpitations or shortness of breath or edema.   ROS:  Please see the history of present illness. Aside from mentioned under HPI, all other systems are reviewed and negative.     Past Medical History  Diagnosis Date  . Anxiety   . Hypertension   . PVC (premature ventricular contraction)     Past Surgical History  Procedure Laterality Date  . Cesarean section       reports that she quit smoking about 4 months ago. She has never used smokeless tobacco. She reports that she does not drink alcohol or use illicit drugs.   family history includes Diabetes in her father and mother; Heart disease in her father; Heart failure in her father. There is no history of Cancer.   Current Outpatient Prescriptions  Medication Sig Dispense Refill  . labetalol (NORMODYNE)  100 MG tablet Take 1 tablet (100 mg total) by mouth 2 (two) times daily. 60 tablet 1  . Prenatal Vit-Fe Fumarate-FA (PRENATAL MULTIVITAMIN) TABS tablet Take 1 tablet by mouth daily at 12 noon.    Marland Kitchen. aspirin EC 81 MG tablet Take 1 tablet (81 mg total) by mouth daily. Take after 12 weeks for prevention of preeclampssia later in pregnancy (Patient not taking: Reported on 01/15/2016) 300 tablet 2   No current facility-administered medications for this visit.    Allergies: Review of patient's allergies indicates no known allergies.    PHYSICAL EXAM: VS:  BP 110/78 mmHg  Pulse 94  Ht 5' 6.5" (1.689 m)  Wt 172 lb 8 oz (78.245 kg)  BMI 27.43 kg/m2  LMP 08/24/2015 (Exact Date) , Body mass index is 27.43 kg/(m^2). Wt Readings from Last 3 Encounters:  01/30/16 172 lb 8 oz (78.245 kg)  01/17/16 170 lb 3.2 oz (77.202 kg)  01/15/16 166 lb (75.297 kg)    GENERAL:  well developed, well nourished, not in acute distress HEENT: normocephalic, pink conjunctivae, anicteric sclerae, no xanthelasma, normal dentition, oropharynx clear NECK:  no neck vein engorgement, JVP normal, no hepatojugular reflux, carotid upstroke brisk and symmetric, no bruit, no thyromegaly, no lymphadenopathy LUNGS:  good respiratory effort, clear to auscultation bilaterally CV:  PMI not displaced, no thrills, no lifts, S1 and S2 within normal limits, no palpable S3 or S4, no murmurs, no rubs, no gallops ABD:  Soft, nontender, gravid abomen, unable to assess hepatomegaly, no splenomegaly MS: nontender back, no kyphosis, no scoliosis, no joint deformities EXT:  2+ DP/PT pulses, ankle edema, no varicosities, no cyanosis, no clubbing SKIN: warm, nondiaphoretic, normal turgor, no ulcers NEUROPSYCH: alert, oriented to person, place, and time, sensory/motor grossly intact, normal mood, appropriate affect  Recent Labs: 10/24/2015: ALT 18 01/15/2016: BUN 14; Creatinine, Ser 0.45; Hemoglobin 13.5; Magnesium 2.0; Platelets 340; Potassium 3.6;  Sodium 131*   Lipid Panel No results found for: CHOL, TRIG, HDL, CHOLHDL, VLDL, LDLCALC, LDLDIRECT   Other studies Reviewed:  EKG:  The ekg from 01/30/2016 was personally reviewed by me and it revealed SR 94. EKG from 01/08/2016 showed sinus rhythm with occasional PVCs.  Additional studies/ records that were reviewed personally reviewed by me today include: None available   ASSESSMENT AND PLAN:  Palpitations PVCs Recommend echocardiogram and 24-hour Holter monitor for further evaluation. We discussed in detail the physiologic changes that are expected in pregnancy during this trimester. Advised patient to keep well hydrated, and avoid caffeine. Electrolytes recently were within normal limits. We do not see TSH and free T4 and hence we will order for today. Patient already on labetalol for preeclampsia.  Bipedal edema Likely secondary to pregnancy changes, preeclampsia. Recommend to avoid prolonged standing. May use compression stockings if tolerated. Recommend to avoid excessive sodium intake.  Current medicines are reviewed at length with the patient today.  The patient does not have concerns regarding medicines.  Labs/ tests ordered today include:  Orders Placed This Encounter  Procedures  . TSH  . T4, free  . Holter monitor - 24 hour  . EKG 12-Lead  . Echocardiogram    I had a lengthy and detailed discussion with the patient regarding diagnoses, prognosis, diagnostic options, treatment options , and side effects of medications.     Disposition:   FU with undersigned after tests   Signed, Almond Lint, MD  01/30/2016 3:01 PM    [facility]    Pain

## 2016-01-30 NOTE — Patient Instructions (Addendum)
Medication Instructions:  Your physician recommends that you continue on your current medications as directed. Please refer to the Current Medication list given to you today.   Labwork: Labs today TSH and Free T4   Testing/Procedures: Your physician has requested that you have an echocardiogram. Echocardiography is a painless test that uses sound waves to create images of your heart. It provides your doctor with information about the size and shape of your heart and how well your heart's chambers and valves are working. This procedure takes approximately one hour. There are no restrictions for this procedure.  Date & Time: ________________________________________________________________  Your physician has recommended that you wear a holter monitor. Holter monitors are medical devices that record the heart's electrical activity. Doctors most often use these monitors to diagnose arrhythmias. Arrhythmias are problems with the speed or rhythm of the heartbeat. The monitor is a small, portable device. You can wear one while you do your normal daily activities. This is usually used to diagnose what is causing palpitations/syncope (passing out).  Date & Time:___________________________________________________________________  Follow-Up: Your physician recommends that you schedule a follow-up appointment after testing to review labs.  Date & Time:______________________________________________________________________   Any Other Special Instructions Will Be Listed Below (If Applicable).     If you need a refill on your cardiac medications before your next appointment, please call your pharmacy.  Echocardiogram An echocardiogram, or echocardiography, uses sound waves (ultrasound) to produce an image of your heart. The echocardiogram is simple, painless, obtained within a short period of time, and offers valuable information to your health care provider. The images from an echocardiogram can  provide information such as:  Evidence of coronary artery disease (CAD).  Heart size.  Heart muscle function.  Heart valve function.  Aneurysm detection.  Evidence of a past heart attack.  Fluid buildup around the heart.  Heart muscle thickening.  Assess heart valve function. LET [facility] CARE PROVIDER KNOW ABOUT:  Any allergies you have.  All medicines you are taking, including vitamins, herbs, eye drops, creams, and over-the-counter medicines.  Previous problems you or members of your family have had with the use of anesthetics.  Any blood disorders you have.  Previous surgeries you have had.  Medical conditions you have.  Possibility of pregnancy, if this applies. BEFORE THE PROCEDURE  No special preparation is needed. Eat and drink normally.  PROCEDURE   In order to produce an image of your heart, gel will be applied to your chest and a wand-like tool (transducer) will be moved over your chest. The gel will help transmit the sound waves from the transducer. The sound waves will harmlessly bounce off your heart to allow the heart images to be captured in real-time motion. These images will then be recorded.  You may need an IV to receive a medicine that improves the quality of the pictures. AFTER THE PROCEDURE You may return to your normal schedule including diet, activities, and medicines, unless your health care provider tells you otherwise.   This information is not intended to replace advice given to you by your health care provider. Make sure you discuss any questions you have with your health care provider.   Document Released: 10/04/2000 Document Revised: 10/28/2014 Document Reviewed: 06/14/2013 Elsevier Interactive Patient Education 2016 Elsevier Inc.    Holter Monitoring A Holter monitor is a small device that is used to detect abnormal heart rhythms. It clips to your clothing and is connected by wires to flat, sticky disks (electrodes) that attach  to your chest. It is worn continuously for 24-48 hours. HOME CARE INSTRUCTIONS  Wear your Holter monitor at all times, even while exercising and sleeping, for as long as directed by your health care provider.  Make sure that the Holter monitor is safely clipped to your clothing or close to your body as recommended by your health care provider.  Do not get the monitor or wires wet.  Do not put body lotion or moisturizer on your chest.  Keep your skin clean.  Keep a diary of your daily activities, such as walking and doing chores. If you feel that your heartbeat is abnormal or that your heart is fluttering or skipping a beat:  Record what you are doing when it happens.  Record what time of day the symptoms occur.  Return your Holter monitor as directed by your health care provider.  Keep all follow-up visits as directed by your health care provider. This is important. SEEK IMMEDIATE MEDICAL CARE IF:  You feel lightheaded or you faint.  You have trouble breathing.  You feel pain in your chest, upper arm, or jaw.  You feel sick to your stomach and your skin is pale, cool, or damp.  You heartbeat feels unusual or abnormal.   This information is not intended to replace advice given to you by your health care provider. Make sure you discuss any questions you have with your health care provider.   Document Released: 07/05/2004 Document Revised: 10/28/2014 Document Reviewed: 05/16/2014 Elsevier Interactive Patient Education [employer] Inc[employer]

## 2016-01-31 ENCOUNTER — Telehealth: Payer: Self-pay | Admitting: Cardiology

## 2016-01-31 LAB — TSH: TSH: 1.26 u[IU]/mL (ref 0.450–4.500)

## 2016-01-31 LAB — T4, FREE: Free T4: 0.9 ng/dL (ref 0.82–1.77)

## 2016-01-31 NOTE — Telephone Encounter (Signed)
Spoke with patient and let her know that we are not in network with her insurance. Instructed her to contact her PCP to find a cardiologist that is considered in network for her and then to notify me and I would forward our note over to them. Let her know that we would go ahead and cancel her testing that was scheduled with our office and follow up. She verbalized understanding of all instructions and provided her with my name and number in case she needs any further assistance.

## 2016-02-08 ENCOUNTER — Other Ambulatory Visit: Payer: Self-pay | Admitting: Obstetrics and Gynecology

## 2016-02-08 DIAGNOSIS — I491 Atrial premature depolarization: Secondary | ICD-10-CM

## 2016-02-15 ENCOUNTER — Ambulatory Visit (INDEPENDENT_AMBULATORY_CARE_PROVIDER_SITE_OTHER): Admitting: Obstetrics and Gynecology

## 2016-02-15 VITALS — BP 131/83 | HR 96 | Wt 177.2 lb

## 2016-02-15 DIAGNOSIS — O10019 Pre-existing essential hypertension complicating pregnancy, unspecified trimester: Secondary | ICD-10-CM

## 2016-02-15 DIAGNOSIS — O10919 Unspecified pre-existing hypertension complicating pregnancy, unspecified trimester: Secondary | ICD-10-CM

## 2016-02-15 DIAGNOSIS — I491 Atrial premature depolarization: Secondary | ICD-10-CM

## 2016-02-15 DIAGNOSIS — O09522 Supervision of elderly multigravida, second trimester: Secondary | ICD-10-CM

## 2016-02-15 NOTE — Progress Notes (Signed)
ROB: Doing well.  Denies complaints.  Notes cardiac complaints have improved some.  Has new cardiology appt Monday (seen by [facility] initially but don't take her insurance.  [facility]  RTC in 2 weeks.  For 28 week labs at that time.

## 2016-02-16 ENCOUNTER — Encounter

## 2016-02-16 ENCOUNTER — Other Ambulatory Visit

## 2016-02-29 ENCOUNTER — Ambulatory Visit: Admitting: Cardiology

## 2016-02-29 ENCOUNTER — Ambulatory Visit (INDEPENDENT_AMBULATORY_CARE_PROVIDER_SITE_OTHER): Admitting: Obstetrics and Gynecology

## 2016-02-29 VITALS — BP 109/75 | HR 101 | Wt 177.9 lb

## 2016-02-29 DIAGNOSIS — Z3483 Encounter for supervision of other normal pregnancy, third trimester: Secondary | ICD-10-CM

## 2016-02-29 DIAGNOSIS — O09523 Supervision of elderly multigravida, third trimester: Secondary | ICD-10-CM

## 2016-02-29 DIAGNOSIS — Z131 Encounter for screening for diabetes mellitus: Secondary | ICD-10-CM

## 2016-02-29 DIAGNOSIS — Z23 Encounter for immunization: Secondary | ICD-10-CM | POA: Diagnosis not present

## 2016-02-29 DIAGNOSIS — O10019 Pre-existing essential hypertension complicating pregnancy, unspecified trimester: Secondary | ICD-10-CM

## 2016-02-29 DIAGNOSIS — Z87440 Personal history of urinary (tract) infections: Secondary | ICD-10-CM

## 2016-02-29 DIAGNOSIS — I491 Atrial premature depolarization: Secondary | ICD-10-CM

## 2016-02-29 DIAGNOSIS — O10919 Unspecified pre-existing hypertension complicating pregnancy, unspecified trimester: Secondary | ICD-10-CM

## 2016-02-29 DIAGNOSIS — Z3493 Encounter for supervision of normal pregnancy, unspecified, third trimester: Secondary | ICD-10-CM

## 2016-02-29 LAB — POCT URINALYSIS DIPSTICK
Bilirubin, UA: NEGATIVE
Glucose, UA: NEGATIVE
Ketones, UA: NEGATIVE
Leukocytes, UA: NEGATIVE
Nitrite, UA: NEGATIVE
SPEC GRAV UA: 1.02
UROBILINOGEN UA: NEGATIVE
pH, UA: 6.5

## 2016-02-29 MED ORDER — TETANUS-DIPHTH-ACELL PERTUSSIS 5-2.5-18.5 LF-MCG/0.5 IM SUSP
0.5000 mL | Freq: Once | INTRAMUSCULAR | Status: AC
  Administered 2016-02-29: 0.5 mL via INTRAMUSCULAR

## 2016-02-29 NOTE — Progress Notes (Signed)
ROB: Doing well, denies complaints.  Completed 24 hr Holter monitoring last week, has appointment with Cardiology next week for review. Desires to breastfeed, unsure contraception, considering Paraguard IUD. Discussed cord blood banking, blood consent signed, Tdap given today.  For 28 week labs today.  RTC in 2 weeks.

## 2016-02-29 NOTE — Addendum Note (Signed)
Addended by: Debbe BalesGLANTON, Eddye Broxterman C on: 02/29/2016 12:30 PM   Modules accepted: Orders

## 2016-03-01 LAB — HEMOGLOBIN AND HEMATOCRIT, BLOOD
HEMATOCRIT: 35.4 % (ref 34.0–46.6)
HEMOGLOBIN: 11.8 g/dL (ref 11.1–15.9)

## 2016-03-01 LAB — URINE CULTURE

## 2016-03-01 LAB — GLUCOSE TOLERANCE, 1 HOUR: Glucose, 1Hr PP: 134 mg/dL (ref 65–199)

## 2016-03-04 ENCOUNTER — Telehealth: Payer: Self-pay

## 2016-03-04 NOTE — Telephone Encounter (Signed)
-----   Message from Hildred LaserAnika Cherry, MD sent at 03/02/2016 11:47 AM EDT ----- Please inform of normal glucola.

## 2016-03-04 NOTE — Telephone Encounter (Signed)
Called pt informed her of normal 1hr.

## 2016-03-13 ENCOUNTER — Encounter: Admitting: Obstetrics and Gynecology

## 2016-03-27 ENCOUNTER — Encounter: Payer: Self-pay | Admitting: Obstetrics and Gynecology

## 2016-03-27 ENCOUNTER — Ambulatory Visit (INDEPENDENT_AMBULATORY_CARE_PROVIDER_SITE_OTHER): Admitting: Obstetrics and Gynecology

## 2016-03-27 VITALS — BP 124/77 | HR 105 | Wt 183.5 lb

## 2016-03-27 DIAGNOSIS — I491 Atrial premature depolarization: Secondary | ICD-10-CM

## 2016-03-27 DIAGNOSIS — O34219 Maternal care for unspecified type scar from previous cesarean delivery: Secondary | ICD-10-CM

## 2016-03-27 DIAGNOSIS — R55 Syncope and collapse: Secondary | ICD-10-CM

## 2016-03-27 DIAGNOSIS — Z3493 Encounter for supervision of normal pregnancy, unspecified, third trimester: Secondary | ICD-10-CM

## 2016-03-27 DIAGNOSIS — O09523 Supervision of elderly multigravida, third trimester: Secondary | ICD-10-CM

## 2016-03-27 DIAGNOSIS — Z3483 Encounter for supervision of other normal pregnancy, third trimester: Secondary | ICD-10-CM

## 2016-03-27 DIAGNOSIS — O10919 Unspecified pre-existing hypertension complicating pregnancy, unspecified trimester: Secondary | ICD-10-CM

## 2016-03-27 DIAGNOSIS — O10019 Pre-existing essential hypertension complicating pregnancy, unspecified trimester: Secondary | ICD-10-CM

## 2016-03-27 LAB — POCT URINALYSIS DIPSTICK
Bilirubin, UA: NEGATIVE
GLUCOSE UA: NEGATIVE
Ketones, UA: NEGATIVE
LEUKOCYTES UA: NEGATIVE
NITRITE UA: NEGATIVE
Spec Grav, UA: >=1.03
UROBILINOGEN UA: NEGATIVE
pH, UA: 6

## 2016-03-27 NOTE — Progress Notes (Signed)
ROB: Notes Michelle PeltonBraxton Hicks. Saw Cardiologist, who notes normal findings on Holter monitor.  Continues to remain asymptomatic without cardiac symptoms. For repeat C-section on 05/14/16, scheduled today. 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 Letter provided.   RTC in 2 weeks.

## 2016-04-09 ENCOUNTER — Ambulatory Visit (INDEPENDENT_AMBULATORY_CARE_PROVIDER_SITE_OTHER): Admitting: Obstetrics and Gynecology

## 2016-04-09 VITALS — BP 119/85 | HR 90 | Temp 97.6°F | Wt 186.4 lb

## 2016-04-09 DIAGNOSIS — O34219 Maternal care for unspecified type scar from previous cesarean delivery: Secondary | ICD-10-CM

## 2016-04-09 DIAGNOSIS — Z3483 Encounter for supervision of other normal pregnancy, third trimester: Secondary | ICD-10-CM | POA: Diagnosis not present

## 2016-04-09 DIAGNOSIS — O09523 Supervision of elderly multigravida, third trimester: Secondary | ICD-10-CM | POA: Diagnosis not present

## 2016-04-09 DIAGNOSIS — O10919 Unspecified pre-existing hypertension complicating pregnancy, unspecified trimester: Secondary | ICD-10-CM

## 2016-04-09 DIAGNOSIS — O10019 Pre-existing essential hypertension complicating pregnancy, unspecified trimester: Secondary | ICD-10-CM

## 2016-04-09 DIAGNOSIS — Z3493 Encounter for supervision of normal pregnancy, unspecified, third trimester: Secondary | ICD-10-CM

## 2016-04-09 NOTE — Progress Notes (Signed)
Unable to void

## 2016-04-09 NOTE — Progress Notes (Signed)
ROB: Patient c/o nasal congestion, coughing up clear phlegm, and fatigue x 1 week. Denies fevers/chills.  Advised on Sudafed or Mucinex for congestion, Robitussin for cough.  Needs to begin twice weekly NSTs, and for growth scan at ~ 36 weeks for cHTN on Labetalol.  Unable to void today.  RTC in 2 weeks. For 36 week labs at that time.

## 2016-04-16 ENCOUNTER — Ambulatory Visit (INDEPENDENT_AMBULATORY_CARE_PROVIDER_SITE_OTHER): Admitting: Obstetrics and Gynecology

## 2016-04-16 VITALS — BP 124/85 | HR 99 | Wt 187.4 lb

## 2016-04-16 DIAGNOSIS — Z36 Encounter for antenatal screening of mother: Secondary | ICD-10-CM

## 2016-04-16 DIAGNOSIS — O10019 Pre-existing essential hypertension complicating pregnancy, unspecified trimester: Secondary | ICD-10-CM

## 2016-04-16 DIAGNOSIS — Z3493 Encounter for supervision of normal pregnancy, unspecified, third trimester: Secondary | ICD-10-CM

## 2016-04-16 DIAGNOSIS — Z113 Encounter for screening for infections with a predominantly sexual mode of transmission: Secondary | ICD-10-CM

## 2016-04-16 DIAGNOSIS — Z3685 Encounter for antenatal screening for Streptococcus B: Secondary | ICD-10-CM

## 2016-04-16 DIAGNOSIS — O34219 Maternal care for unspecified type scar from previous cesarean delivery: Secondary | ICD-10-CM

## 2016-04-16 DIAGNOSIS — O10919 Unspecified pre-existing hypertension complicating pregnancy, unspecified trimester: Secondary | ICD-10-CM

## 2016-04-16 NOTE — Progress Notes (Signed)
ROB: Patient notes URI symptoms have mostly resolved. For NST's for cHTN, growth scan next week. 36 week labs done today. Patient unable to void for visit.

## 2016-04-19 LAB — GC/CHLAMYDIA PROBE AMP
CHLAMYDIA, DNA PROBE: NEGATIVE
Neisseria gonorrhoeae by PCR: NEGATIVE

## 2016-04-20 LAB — CULTURE, BETA STREP (GROUP B ONLY): STREP GP B CULTURE: POSITIVE — AB

## 2016-04-24 ENCOUNTER — Ambulatory Visit (INDEPENDENT_AMBULATORY_CARE_PROVIDER_SITE_OTHER)

## 2016-04-24 DIAGNOSIS — O10019 Pre-existing essential hypertension complicating pregnancy, unspecified trimester: Secondary | ICD-10-CM | POA: Diagnosis not present

## 2016-04-24 DIAGNOSIS — Z0289 Encounter for other administrative examinations: Secondary | ICD-10-CM

## 2016-04-24 DIAGNOSIS — O10919 Unspecified pre-existing hypertension complicating pregnancy, unspecified trimester: Secondary | ICD-10-CM

## 2016-05-02 ENCOUNTER — Telehealth: Payer: Self-pay | Admitting: Obstetrics and Gynecology

## 2016-05-02 NOTE — Telephone Encounter (Signed)
[redacted] wks pregnant, fingers are locking up, especially the pinky finger, especially in the morning, ringing in ears the past couple of days

## 2016-05-03 NOTE — Telephone Encounter (Signed)
Called pt she states she is concerned about her hands swelling and pinky finger "locking up" pt states that it is painful but is relieved by tylenol. Gave pt reassurance.

## 2016-05-07 ENCOUNTER — Ambulatory Visit (INDEPENDENT_AMBULATORY_CARE_PROVIDER_SITE_OTHER): Admitting: Obstetrics and Gynecology

## 2016-05-07 ENCOUNTER — Encounter: Payer: Self-pay | Admitting: Obstetrics and Gynecology

## 2016-05-07 VITALS — BP 137/87 | HR 86 | Wt 192.1 lb

## 2016-05-07 DIAGNOSIS — Z3483 Encounter for supervision of other normal pregnancy, third trimester: Secondary | ICD-10-CM

## 2016-05-07 DIAGNOSIS — O34219 Maternal care for unspecified type scar from previous cesarean delivery: Secondary | ICD-10-CM

## 2016-05-07 DIAGNOSIS — Z3493 Encounter for supervision of normal pregnancy, unspecified, third trimester: Secondary | ICD-10-CM

## 2016-05-07 DIAGNOSIS — O09523 Supervision of elderly multigravida, third trimester: Secondary | ICD-10-CM

## 2016-05-07 DIAGNOSIS — O10919 Unspecified pre-existing hypertension complicating pregnancy, unspecified trimester: Secondary | ICD-10-CM

## 2016-05-07 DIAGNOSIS — O10019 Pre-existing essential hypertension complicating pregnancy, unspecified trimester: Secondary | ICD-10-CM

## 2016-05-07 LAB — POCT URINALYSIS DIPSTICK
BILIRUBIN UA: NEGATIVE
Blood, UA: NEGATIVE
Glucose, UA: NEGATIVE
KETONES UA: NEGATIVE
Nitrite, UA: NEGATIVE
PH UA: 6
PROTEIN UA: 30
Urobilinogen, UA: NEGATIVE

## 2016-05-07 NOTE — Progress Notes (Signed)
ROB: Patient notes that she feels that her fingers are locking.  Patient with normal extremity exam.  S/p NST today, normal today.  BPs slowly elevating (but still wnl), +1 proteinuria.  Does note occasional contractions. Labor precautions given. Scheduled for repeat C-section next week. Continue NSTs. To d/c baby aspirin.

## 2016-05-08 ENCOUNTER — Telehealth: Payer: Self-pay

## 2016-05-08 NOTE — Telephone Encounter (Signed)
Called pt informed her of information below, pt also questions day of her c-section as she has been told 2 different dates, Michelle NissenKristal can you look into this?

## 2016-05-08 NOTE — Telephone Encounter (Signed)
-----   Message from Michelle LaserAnika Cherry, MD sent at 05/07/2016  9:46 PM EDT ----- Regarding: aspirin Please call patient and have her stop the baby aspirin in preparation for her upcoming surgery next week.

## 2016-05-09 NOTE — Telephone Encounter (Signed)
Spoke with patient. Patient is clear on c section date and pre admit date.-KEC

## 2016-05-14 ENCOUNTER — Encounter
Admission: RE | Admit: 2016-05-14 | Discharge: 2016-05-14 | Disposition: A | Discharge status: Home / Self Care | Source: Ambulatory Visit | Attending: Obstetrics and Gynecology | Admitting: Obstetrics and Gynecology

## 2016-05-14 HISTORY — DX: Edema, unspecified: R60.9

## 2016-05-14 HISTORY — DX: Reserved for inherently not codable concepts without codable children: IMO0001

## 2016-05-14 HISTORY — DX: Cramp and spasm: R25.2

## 2016-05-14 LAB — RAPID HIV SCREEN (HIV 1/2 AB+AG)
HIV 1/2 Antibodies: NONREACTIVE
HIV-1 P24 ANTIGEN - HIV24: NONREACTIVE

## 2016-05-14 LAB — CBC
HCT: 32.1 % — ABNORMAL LOW (ref 35.0–47.0)
Hemoglobin: 10.6 g/dL — ABNORMAL LOW (ref 12.0–16.0)
MCH: 26.5 pg (ref 26.0–34.0)
MCHC: 33.2 g/dL (ref 32.0–36.0)
MCV: 79.9 fL — ABNORMAL LOW (ref 80.0–100.0)
PLATELETS: 285 10*3/uL (ref 150–440)
RBC: 4.01 MIL/uL (ref 3.80–5.20)
RDW: 15.5 % — ABNORMAL HIGH (ref 11.5–14.5)
WBC: 11 10*3/uL (ref 3.6–11.0)

## 2016-05-14 LAB — TYPE AND SCREEN
ABO/RH(D): O POS
ANTIBODY SCREEN: NEGATIVE
Extend sample reason: UNDETERMINED

## 2016-05-14 NOTE — Patient Instructions (Signed)
  Your procedure is scheduled on: 05/15/16 @ 10:15 am Report to Same Day Surgery 2nd floor medical mall   Remember: Instructions that are not followed completely may result in serious medical risk, up to and including death, or upon the discretion of your surgeon and anesthesiologist your surgery may need to be rescheduled.    _x___ 1. Do not eat food or drink liquids after midnight. No gum chewing or hard candies.     __x__ 2. No Alcohol for 24 hours before or after surgery.   __x__3. No Smoking for 24 prior to surgery.   ____  4. Bring all medications with you on the day of surgery if instructed.    __x__ 5. Notify your doctor if there is any change in your medical condition     (cold, fever, infections).     Do not wear jewelry, make-up, hairpins, clips or nail polish.  Do not wear lotions, powders, or perfumes. You may wear deodorant.  Do not shave 48 hours prior to surgery. Men may shave face and neck.  Do not bring valuables to the hospital.    [facility] is not responsible for any belongings or valuables.               Contacts, dentures or bridgework may not be worn into surgery.  Leave your suitcase in the car. After surgery it may be brought to your room.  For patients admitted to the hospital, discharge time is determined by your treatment team.   Patients discharged the day of surgery will not be allowed to drive home.    Please read over the following fact sheets that you were given:   [facility] Preparing for Surgery and or MRSA Information   _x___ Take these medicines the morning of surgery with A SIP OF WATER:    1. Labetalol  2.  3.  4.  5.  6.  ____ Fleet Enema (as directed)   _x___ Use CHG Soap or sage wipes as directed on instruction sheet   ____ Use inhalers on the day of surgery and bring to hospital day of surgery  ____ Stop metformin 2 days prior to surgery    ____ Take 1/2 of usual insulin dose the night before surgery and none on the morning  of           surgery.   ____ Stop aspirin or coumadin, or plavix  _x__ Stop Anti-inflammatories such as Advil, Aleve, Ibuprofen, Motrin, Naproxen,          Naprosyn, Goodies powders or aspirin products. Ok to take Tylenol.   ____ Stop supplements until after surgery.    ____ Bring C-Pap to the hospital.

## 2016-05-14 NOTE — Pre-Procedure Instructions (Signed)
Established Patient Visit   Chief Complaint: Chief Complaint  Patient presents with  . Follow-up  holter  Date of Service: 03/06/2016 Date of Birth: May 25, 1981 PCP: NONE  History of Present Illness: Michelle Owen is a 35 y.o.female patient who returns for evaluation of symptomatic PVCs and shortness of breath. Patient was seen in [facility] emergency room a month ago, noted to have PVCs, with follow-up at [facility]. Since her initial symptoms, patient reports doing better with less palpitations. 24 hour Holter monitor was performed which revealed predominant normal sinus rhythm with a mean heart rate of 102 bpm, sinus tachycardia was noted, with 1 PVC. The patient has minimized her caffeine intake. She was started on labetalol approximately 3 months ago for elevated blood pressure allowing pregnancy. The patient quit smoking.  Past Medical and Surgical History  Past Medical History Past Medical History  Diagnosis Date  . Anxiety, unspecified   Past Surgical History She has a past surgical history that includes Cesarean section.   Medications and Allergies  Current Medications  Current Outpatient Prescriptions  Medication Sig Dispense Refill  . labetalol (TRANDATE) 100 MG tablet Take 100 mg by mouth 2 (two) times daily.  . prenatal vit no.128-iron-FA (PRENATAL VITAMIN) 28 mg iron- 800 mcg Tab Take by mouth.   No current facility-administered medications for this visit.   Allergies: Review of patient's allergies indicates no known allergies.  Social and Family History  Social History reports that she has quit smoking. She does not have any smokeless tobacco history on file. She reports that she drinks alcohol. She reports that she does not use illicit drugs.  Family History Family History  Problem Relation Age of Onset  . Diabetes type II Mother  . Diabetes type II Father  . Heart disease Father   Review of Systems   Review of Systems: The patient denies chest pain,  shortness of breath, orthopnea, paroxysmal nocturnal dyspnea, pedal edema, reports less palpitations, heart racing, without presyncope, syncope. Review of 12 Systems is negative except as described above.  Physical Examination   Vitals: Visit Vitals  . BP (P) 128/80  . Pulse (P) 90  . Ht (P) 168.8 cm (5' 6.45")  . Wt (P) 80.7 kg (178 lb)  . BMI (P) 28.34 kg/m2   Ht:(P) 168.8 cm (5' 6.45") Wt:(P) 80.7 kg (178 lb) PRX:YVOP surface area is 1.95 meters squared (pended). Body mass index is 28.34 kg/(m^2) (pended).  HEENT: Pupils equally reactive to light and accomodation  Neck: Supple without thyromegaly, carotid pulses 2+ Lungs: clear to auscultation bilaterally; no wheezes, rales, rhonchi Heart: Regular rate and rhythm. No gallops, murmurs or rub Abdomen: soft nontender, nondistended, with normal bowel sounds Extremities: no cyanosis, clubbing, or edema Peripheral Pulses: 2+ in all extremities, 2+ femoral pulses bilaterally  Assessment   35 y.o. female with  1. Atrial premature depolarization  2. Premature ventricular contractions  3. Generalized anxiety disorder  4. Tachycardia, unspecified   35 year old female, [redacted] weeks pregnant, with symptomatic PVCs, which have improved, with recent Holter monitor revealing predominant sinus rhythm with one PVC, currently appears clinically stable. Patient does have elevated blood pressure during pregnancy, controlled with labetalol.  Plan   1. Continue current medications 2. Counseled patient about limiting her caffeine intake 3. Defer further cardiac diagnostics at this time 4. Return to clinic as needed  No orders of the defined types were placed in this encounter.  Return if symptoms worsen or fail to improve.  Marcina Millard, MD

## 2016-05-15 ENCOUNTER — Encounter
Admission: RE | Disposition: A | Discharge status: Home / Self Care | Payer: Self-pay | Source: Ambulatory Visit | Attending: Obstetrics and Gynecology

## 2016-05-15 ENCOUNTER — Inpatient Hospital Stay: Admitting: Certified Registered"

## 2016-05-15 ENCOUNTER — Inpatient Hospital Stay
Admission: RE | Admit: 2016-05-15 | Discharge: 2016-05-17 | DRG: 765 | Disposition: A | Discharge status: Home / Self Care | Source: Ambulatory Visit | Attending: Obstetrics and Gynecology | Admitting: Obstetrics and Gynecology

## 2016-05-15 DIAGNOSIS — O09529 Supervision of elderly multigravida, unspecified trimester: Secondary | ICD-10-CM

## 2016-05-15 DIAGNOSIS — Z8249 Family history of ischemic heart disease and other diseases of the circulatory system: Secondary | ICD-10-CM

## 2016-05-15 DIAGNOSIS — O99824 Streptococcus B carrier state complicating childbirth: Secondary | ICD-10-CM | POA: Diagnosis present

## 2016-05-15 DIAGNOSIS — N939 Abnormal uterine and vaginal bleeding, unspecified: Secondary | ICD-10-CM | POA: Diagnosis present

## 2016-05-15 DIAGNOSIS — Z3483 Encounter for supervision of other normal pregnancy, third trimester: Secondary | ICD-10-CM | POA: Diagnosis not present

## 2016-05-15 DIAGNOSIS — O34211 Maternal care for low transverse scar from previous cesarean delivery: Principal | ICD-10-CM | POA: Diagnosis present

## 2016-05-15 DIAGNOSIS — Z3A39 39 weeks gestation of pregnancy: Secondary | ICD-10-CM

## 2016-05-15 DIAGNOSIS — R42 Dizziness and giddiness: Secondary | ICD-10-CM | POA: Diagnosis not present

## 2016-05-15 DIAGNOSIS — O1092 Unspecified pre-existing hypertension complicating childbirth: Secondary | ICD-10-CM | POA: Diagnosis present

## 2016-05-15 DIAGNOSIS — Z87891 Personal history of nicotine dependence: Secondary | ICD-10-CM | POA: Diagnosis not present

## 2016-05-15 DIAGNOSIS — N390 Urinary tract infection, site not specified: Secondary | ICD-10-CM | POA: Diagnosis not present

## 2016-05-15 DIAGNOSIS — I10 Essential (primary) hypertension: Secondary | ICD-10-CM | POA: Diagnosis not present

## 2016-05-15 DIAGNOSIS — Z98891 History of uterine scar from previous surgery: Secondary | ICD-10-CM

## 2016-05-15 LAB — RPR: RPR: NONREACTIVE

## 2016-05-15 SURGERY — Surgical Case
Anesthesia: Regional

## 2016-05-15 MED ORDER — NALBUPHINE HCL 10 MG/ML IJ SOLN: 5.0000 mg | Freq: Once | INTRAMUSCULAR | Status: DC | PRN

## 2016-05-15 MED ORDER — FENTANYL CITRATE (PF) 100 MCG/2ML IJ SOLN
INTRAMUSCULAR | Status: DC | PRN
  Administered 2016-05-15: 20 ug via INTRATHECAL

## 2016-05-15 MED ORDER — SENNOSIDES-DOCUSATE SODIUM 8.6-50 MG PO TABS
2.0000 | ORAL_TABLET | ORAL | Status: DC
  Administered 2016-05-16 (×2): 2 via ORAL
  Filled 2016-05-15 (×2): qty 2

## 2016-05-15 MED ORDER — DIPHENHYDRAMINE HCL 25 MG PO CAPS: 25.0000 mg | ORAL_CAPSULE | Freq: Four times a day (QID) | ORAL | Status: DC | PRN

## 2016-05-15 MED ORDER — OXYCODONE-ACETAMINOPHEN 5-325 MG PO TABS
1.0000 | ORAL_TABLET | ORAL | Status: DC | PRN
  Administered 2016-05-16 (×4): 1 via ORAL
  Filled 2016-05-15 (×3): qty 1

## 2016-05-15 MED ORDER — KETOROLAC TROMETHAMINE 30 MG/ML IJ SOLN
30.0000 mg | Freq: Four times a day (QID) | INTRAMUSCULAR | Status: AC | PRN
  Administered 2016-05-15 – 2016-05-16 (×2): 30 mg via INTRAVENOUS
  Filled 2016-05-15: qty 1

## 2016-05-15 MED ORDER — IBUPROFEN 600 MG PO TABS
600.0000 mg | ORAL_TABLET | Freq: Four times a day (QID) | ORAL | Status: DC
  Administered 2016-05-16 – 2016-05-17 (×5): 600 mg via ORAL
  Filled 2016-05-15 (×5): qty 1

## 2016-05-15 MED ORDER — LACTATED RINGERS IV SOLN
Freq: Once | INTRAVENOUS | Status: AC
  Administered 2016-05-15: 11:00:00 via INTRAVENOUS

## 2016-05-15 MED ORDER — NALBUPHINE HCL 10 MG/ML IJ SOLN: 5.0000 mg | INTRAMUSCULAR | Status: DC | PRN

## 2016-05-15 MED ORDER — PRENATAL MULTIVITAMIN CH
1.0000 | ORAL_TABLET | Freq: Every day | ORAL | Status: DC
  Administered 2016-05-17: 1 via ORAL
  Filled 2016-05-15 (×2): qty 1

## 2016-05-15 MED ORDER — DIBUCAINE 1 % RE OINT: 1.0000 "application " | TOPICAL_OINTMENT | RECTAL | Status: DC | PRN

## 2016-05-15 MED ORDER — SOD CITRATE-CITRIC ACID 500-334 MG/5ML PO SOLN
ORAL | Status: AC
  Administered 2016-05-15: 30 mL
  Filled 2016-05-15: qty 15

## 2016-05-15 MED ORDER — OXYTOCIN 40 UNITS IN LACTATED RINGERS INFUSION - SIMPLE MED
2.5000 [IU]/h | INTRAVENOUS | Status: DC
  Filled 2016-05-15: qty 1000

## 2016-05-15 MED ORDER — LACTATED RINGERS IV SOLN: INTRAVENOUS | Status: DC

## 2016-05-15 MED ORDER — DIPHENHYDRAMINE HCL 50 MG/ML IJ SOLN: 12.5000 mg | INTRAMUSCULAR | Status: DC | PRN

## 2016-05-15 MED ORDER — DIPHENHYDRAMINE HCL 25 MG PO CAPS: 25.0000 mg | ORAL_CAPSULE | ORAL | Status: DC | PRN

## 2016-05-15 MED ORDER — COCONUT OIL OIL: 1.0000 "application " | TOPICAL_OIL | Status: DC | PRN

## 2016-05-15 MED ORDER — PHENYLEPHRINE HCL 10 MG/ML IJ SOLN
INTRAMUSCULAR | Status: DC | PRN
  Administered 2016-05-15: 200 ug via INTRAVENOUS
  Administered 2016-05-15: 100 ug via INTRAVENOUS
  Administered 2016-05-15: 200 ug via INTRAVENOUS

## 2016-05-15 MED ORDER — ONDANSETRON HCL 4 MG/2ML IJ SOLN
INTRAMUSCULAR | Status: DC | PRN
  Administered 2016-05-15: 4 mg via INTRAVENOUS

## 2016-05-15 MED ORDER — KETOROLAC TROMETHAMINE 30 MG/ML IJ SOLN
INTRAMUSCULAR | Status: DC | PRN
  Administered 2016-05-15: 30 mg via INTRAMUSCULAR

## 2016-05-15 MED ORDER — SODIUM CHLORIDE 0.9% FLUSH: 3.0000 mL | INTRAVENOUS | Status: DC | PRN

## 2016-05-15 MED ORDER — NALOXONE HCL 0.4 MG/ML IJ SOLN: 0.4000 mg | INTRAMUSCULAR | Status: DC | PRN

## 2016-05-15 MED ORDER — NALOXONE HCL 2 MG/2ML IJ SOSY
1.0000 ug/kg/h | PREFILLED_SYRINGE | INTRAVENOUS | Status: DC | PRN
  Filled 2016-05-15: qty 2

## 2016-05-15 MED ORDER — MORPHINE SULFATE (PF) 0.5 MG/ML IJ SOLN
INTRAMUSCULAR | Status: DC | PRN
  Administered 2016-05-15: .2 mg via INTRATHECAL

## 2016-05-15 MED ORDER — BUPIVACAINE IN DEXTROSE 0.75-8.25 % IT SOLN
INTRATHECAL | Status: DC | PRN
  Administered 2016-05-15: 1.5 mL via INTRATHECAL

## 2016-05-15 MED ORDER — OXYTOCIN 40 UNITS IN LACTATED RINGERS INFUSION - SIMPLE MED
INTRAVENOUS | Status: DC | PRN
  Administered 2016-05-15: 1000 mL via INTRAVENOUS

## 2016-05-15 MED ORDER — SIMETHICONE 80 MG PO CHEW
80.0000 mg | CHEWABLE_TABLET | ORAL | Status: DC
  Administered 2016-05-16 (×2): 80 mg via ORAL
  Filled 2016-05-15 (×2): qty 1

## 2016-05-15 MED ORDER — MORPHINE SULFATE (PF) 2 MG/ML IV SOLN
2.0000 mg | INTRAVENOUS | Status: DC | PRN
  Administered 2016-05-15: 2 mg via INTRAVENOUS
  Filled 2016-05-15: qty 1

## 2016-05-15 MED ORDER — MEPERIDINE HCL 25 MG/ML IJ SOLN: 6.2500 mg | INTRAMUSCULAR | Status: DC | PRN

## 2016-05-15 MED ORDER — ACETAMINOPHEN 325 MG PO TABS
650.0000 mg | ORAL_TABLET | ORAL | Status: DC | PRN
  Filled 2016-05-15: qty 2

## 2016-05-15 MED ORDER — ONDANSETRON HCL 4 MG/2ML IJ SOLN: 4.0000 mg | Freq: Once | INTRAMUSCULAR | Status: DC | PRN

## 2016-05-15 MED ORDER — CEFAZOLIN SODIUM-DEXTROSE 2-4 GM/100ML-% IV SOLN
2.0000 g | INTRAVENOUS | Status: AC
  Administered 2016-05-15: 2 g via INTRAVENOUS
  Filled 2016-05-15: qty 100

## 2016-05-15 MED ORDER — MENTHOL 3 MG MT LOZG
1.0000 | LOZENGE | OROMUCOSAL | Status: DC | PRN
  Filled 2016-05-15: qty 9

## 2016-05-15 MED ORDER — ZOLPIDEM TARTRATE 5 MG PO TABS: 5.0000 mg | ORAL_TABLET | Freq: Every evening | ORAL | Status: DC | PRN

## 2016-05-15 MED ORDER — OXYTOCIN 40 UNITS IN LACTATED RINGERS INFUSION - SIMPLE MED
INTRAVENOUS | Status: AC
  Filled 2016-05-15: qty 1000

## 2016-05-15 MED ORDER — LACTATED RINGERS IV SOLN
INTRAVENOUS | Status: DC
  Administered 2016-05-15: 13:00:00 via INTRAVENOUS

## 2016-05-15 MED ORDER — ACETAMINOPHEN 500 MG PO TABS
1000.0000 mg | ORAL_TABLET | Freq: Four times a day (QID) | ORAL | Status: AC
  Administered 2016-05-15 – 2016-05-16 (×3): 1000 mg via ORAL
  Filled 2016-05-15 (×4): qty 2

## 2016-05-15 MED ORDER — FERROUS SULFATE 325 (65 FE) MG PO TABS
325.0000 mg | ORAL_TABLET | Freq: Two times a day (BID) | ORAL | Status: DC
  Administered 2016-05-16 – 2016-05-17 (×3): 325 mg via ORAL
  Filled 2016-05-15 (×4): qty 1

## 2016-05-15 MED ORDER — LIDOCAINE 5 % EX PTCH
MEDICATED_PATCH | CUTANEOUS | Status: DC | PRN
  Administered 2016-05-15: 1 via TRANSDERMAL

## 2016-05-15 MED ORDER — KETOROLAC TROMETHAMINE 30 MG/ML IJ SOLN
30.0000 mg | Freq: Four times a day (QID) | INTRAMUSCULAR | Status: AC | PRN
  Filled 2016-05-15: qty 1

## 2016-05-15 MED ORDER — FAMOTIDINE 20 MG PO TABS
20.0000 mg | ORAL_TABLET | Freq: Once | ORAL | Status: DC
  Filled 2016-05-15: qty 1

## 2016-05-15 MED ORDER — MAGNESIUM HYDROXIDE 400 MG/5ML PO SUSP: 30.0000 mL | ORAL | Status: DC | PRN

## 2016-05-15 MED ORDER — LIDOCAINE 5 % EX PTCH
1.0000 | MEDICATED_PATCH | CUTANEOUS | Status: DC
  Filled 2016-05-15: qty 1

## 2016-05-15 MED ORDER — SIMETHICONE 80 MG PO CHEW
80.0000 mg | CHEWABLE_TABLET | ORAL | Status: DC | PRN
  Administered 2016-05-16 (×2): 80 mg via ORAL
  Filled 2016-05-15 (×2): qty 1

## 2016-05-15 MED ORDER — OXYTOCIN 40 UNITS IN LACTATED RINGERS INFUSION - SIMPLE MED
2.5000 [IU]/h | INTRAVENOUS | Status: AC
  Filled 2016-05-15: qty 1000

## 2016-05-15 MED ORDER — ONDANSETRON HCL 4 MG/2ML IJ SOLN
4.0000 mg | Freq: Three times a day (TID) | INTRAMUSCULAR | Status: DC | PRN
  Administered 2016-05-15: 4 mg via INTRAVENOUS
  Filled 2016-05-15: qty 2

## 2016-05-15 MED ORDER — EPHEDRINE SULFATE 50 MG/ML IJ SOLN
INTRAMUSCULAR | Status: DC | PRN
  Administered 2016-05-15: 10 mg via INTRAVENOUS

## 2016-05-15 MED ORDER — OXYCODONE-ACETAMINOPHEN 5-325 MG PO TABS
2.0000 | ORAL_TABLET | ORAL | Status: DC | PRN
  Administered 2016-05-16 – 2016-05-17 (×4): 2 via ORAL
  Filled 2016-05-15 (×5): qty 2

## 2016-05-15 MED ORDER — WITCH HAZEL-GLYCERIN EX PADS: 1.0000 "application " | MEDICATED_PAD | CUTANEOUS | Status: DC | PRN

## 2016-05-15 MED ORDER — FENTANYL CITRATE (PF) 100 MCG/2ML IJ SOLN: 25.0000 ug | INTRAMUSCULAR | Status: DC | PRN

## 2016-05-15 SURGICAL SUPPLY — 23 items
BAG COUNTER SPONGE EZ (MISCELLANEOUS) ×2 IMPLANT
CANISTER SUCT 3000ML (MISCELLANEOUS) ×3 IMPLANT
CHLORAPREP W/TINT 26ML (MISCELLANEOUS) ×6 IMPLANT
COUNTER SPONGE BAG EZ (MISCELLANEOUS) ×1
DRSG TELFA 3X8 NADH (GAUZE/BANDAGES/DRESSINGS) ×3 IMPLANT
ELECT REM PT RETURN 9FT ADLT (ELECTROSURGICAL) ×3
ELECTRODE REM PT RTRN 9FT ADLT (ELECTROSURGICAL) ×1 IMPLANT
GAUZE SPONGE 4X4 12PLY STRL (GAUZE/BANDAGES/DRESSINGS) ×3 IMPLANT
GLOVE BIO SURGEON STRL SZ 6 (GLOVE) ×18 IMPLANT
GLOVE BIOGEL PI IND STRL 6.5 (GLOVE) ×1 IMPLANT
GLOVE BIOGEL PI INDICATOR 6.5 (GLOVE) ×2
GOWN STRL REUS W/ TWL LRG LVL3 (GOWN DISPOSABLE) ×2 IMPLANT
GOWN STRL REUS W/TWL LRG LVL3 (GOWN DISPOSABLE) ×4
KIT RM TURNOVER STRD PROC AR (KITS) ×3 IMPLANT
NS IRRIG 1000ML POUR BTL (IV SOLUTION) ×3 IMPLANT
PACK C SECTION AR (MISCELLANEOUS) ×3 IMPLANT
PAD OB MATERNITY 4.3X12.25 (PERSONAL CARE ITEMS) ×3 IMPLANT
PAD PREP 24X41 OB/GYN DISP (PERSONAL CARE ITEMS) ×3 IMPLANT
SUT MNCRL AB 4-0 PS2 18 (SUTURE) ×3 IMPLANT
SUT PLAIN 2 0 XLH (SUTURE) IMPLANT
SUT VIC AB 0 CT1 36 (SUTURE) ×12 IMPLANT
SUT VIC AB 3-0 SH 27 (SUTURE) ×2
SUT VIC AB 3-0 SH 27X BRD (SUTURE) ×1 IMPLANT

## 2016-05-15 NOTE — Progress Notes (Signed)
Pt c/o pain. Anesthesia paged (Kephart & Loney Hering) x2 each for new pain med order since Tylenol did not help her pain.  No page returned from Anesthesia.  Dr. Valentino Saxon then paged and responded.  Orders received to given 2mg  Morphine q1hr prn for pain until anesthesia can be contacted.  Abd binder or pressure dressing to be applied for increase in bleeding to current abd dressing.  LR with 40 pit ordered @ 62.5 also.

## 2016-05-15 NOTE — H&P (Signed)
Obstetric Preoperative History and Physical  Michelle Owen is a 35 y.o. G2P1001 with IUP at [redacted]w[redacted]d presenting for presenting for scheduled repeat cesarean section.  No acute concerns.   Prenatal Course Source of Care: [facility] with onset of care at 14 weeks  Pregnancy complications or risks: Patient Active Problem List   Diagnosis Date Noted  . SOB (shortness of breath) 01/15/2016  . Premature atrial complexes 01/15/2016  . Tachycardia with 100 - 120 beats per minute 01/15/2016  . Advanced maternal age in multigravida 11/21/2015  . Hypertension in pregnancy, pre-existing 11/21/2015  . H/O cesarean section complicating pregnancy 11/21/2015  . Anxiety 11/21/2015   She plans to breastfeed She desires IUD for postpartum contraception.   Prenatal labs and studies: ABO, Rh: --/--/O POS (07/25 1045) Antibody: NEG (07/25 1045) Rubella: <20.0 (01/13 1601) RPR: Non Reactive (07/25 1045)  HBsAg: Negative (01/13 1601)  HIV: Non Reactive (01/13 1601)  GBS:  Positive (6/27 0430) 1 hr Glucola  134, normal Genetic screening: Declined all testing Anatomy US normal   Past Medical History:  Diagnosis Date  . Anxiety   . Cramping of hands   . Edema   . Hypertension   . PVC (premature ventricular contraction)   . Shortness of breath dyspnea    related to pregnancy    Past Surgical History:  Procedure Laterality Date  . CESAREAN SECTION      OB History  Gravida Para Term Preterm AB Living  2 1 1     1   SAB TAB Ectopic Multiple Live Births          1    # Outcome Date GA Lbr Len/2nd Weight Sex Delivery Anes PTL Lv  2 Current           1 Term 03/29/03 [redacted]w[redacted]d  7 lb (3.175 kg) M CS-Unspec  N LIV    Obstetric Comments  C/S-fetal distress, nuchal cord    Social History   Social History  . Marital status: Married    Spouse name: N/A  . Number of children: N/A  . Years of education: N/A   Occupational History  . pharmacy tech [employer]   Social History  Main Topics  . Smoking status: Former Smoker    Quit date: 09/02/2015  . Smokeless tobacco: Former Neurosurgeon    Quit date: 08/15/2015  . Alcohol use No  . Drug use: No  . Sexual activity: Yes    Partners: Male    Birth control/ protection: None     Comment: Pregnant   Other Topics Concern  . Not on file   Social History Narrative  . No narrative on file    Family History  Problem Relation Age of Onset  . Diabetes Mother   . Diabetes Father   . Heart disease Father   . Heart failure Father   . Cancer Neg Hx     No prescriptions prior to admission.    No Known Allergies  Review of Systems: Negative except for what is mentioned in HPI.  Physical Exam:  BP 136/89,  Pulse 88, Ht 5\' 7"  (1.702 m)   Wt 185 lb (83.9 kg)   LMP 08/24/2015 (Exact Date)   BMI 28.98 kg/m  FHR by Doppler: 145 bpm GENERAL: Well-developed, well-nourished female in no acute distress.  LUNGS: Clear to auscultation bilaterally.  HEART: Regular rate and rhythm. ABDOMEN: Soft, nontender, nondistended, gravid, well-healed Pfannenstiel incision. PELVIC: Deferred EXTREMITIES: Nontender, no edema, 2+ distal pulses.   Pertinent  Labs/Studies:   Results for orders placed or performed during the hospital encounter of 05/14/16 (from the past 72 hour(s))  CBC     Status: Abnormal   Collection Time: 05/14/16 10:45 AM  Result Value Ref Range   WBC 11.0 3.6 - 11.0 K/uL   RBC 4.01 3.80 - 5.20 MIL/uL   Hemoglobin 10.6 (L) 12.0 - 16.0 g/dL   HCT 91.4 (L) 78.2 - 95.6 %   MCV 79.9 (L) 80.0 - 100.0 fL   MCH 26.5 26.0 - 34.0 pg   MCHC 33.2 32.0 - 36.0 g/dL   RDW 21.3 (H) 08.6 - 57.8 %   Platelets 285 150 - 440 K/uL  Rapid HIV screen (HIV 1/2 Ab+Ag)     Status: None   Collection Time: 05/14/16 10:45 AM  Result Value Ref Range   HIV-1 P24 Antigen - HIV24 NON REACTIVE NON REACTIVE   HIV 1/2 Antibodies NON REACTIVE NON REACTIVE   Interpretation (HIV Ag Ab)      A non reactive test result means that HIV 1 or HIV 2  antibodies and HIV 1 p24 antigen were not detected in the specimen.  RPR     Status: None   Collection Time: 05/14/16 10:45 AM  Result Value Ref Range   RPR Ser Ql Non Reactive Non Reactive    Comment: (NOTE) Performed At: [facility] [address] [place], [place] [id number] Mila Homer MD UX:[id number]   Type and screen [facility]     Status: None   Collection Time: 05/14/16 10:45 AM  Result Value Ref Range   ABO/RH(D) O POS    Antibody Screen NEG    Sample Expiration 05/17/2016    Extend sample reason PREGNANT WITHIN 3 MONTHS, UNABLE TO EXTEND     Assessment and Plan :Michelle Owen is a 35 y.o. G2P1001 at [redacted]w[redacted]d being admitted  for scheduled repeat cesarean section delivery . The patient is understanding of the planned procedure and is aware of and accepting of all surgical risks, including but not limited to: bleeding which may require transfusion or reoperation; infection which may require antibiotics; injury to bowel, bladder, ureters or other surrounding organs which may require repair; injury to the fetus; need for additional procedures including hysterectomy in the event of life-threatening complications; placental abnormalities wth subsequent pregnancies; incisional problems; blood clot disorders which may require blood thinners;, and other postoperative/anesthesia complications. The patient is in agreement with the proposed plan, and gives informed written consent for the procedure. All questions have been answered.   Hildred Laser, MD [facility]

## 2016-05-15 NOTE — Anesthesia Procedure Notes (Addendum)
Spinal  Patient location during procedure: OR Start time: 05/15/2016 1:40 PM Staffing Anesthesiologist: Molli Barrows Performed: anesthesiologist  Preanesthetic Checklist Completed: patient identified, site marked, surgical consent, pre-op evaluation, timeout performed, IV checked, risks and benefits discussed and monitors and equipment checked Spinal Block Patient position: sitting Prep: Betadine Patient monitoring: heart rate, continuous pulse ox, blood pressure and cardiac monitor Approach: midline Location: L4-5 Injection technique: single-shot Needle Needle type: Whitacre and Introducer  Needle gauge: 24 G Needle length: 9 cm Assessment Sensory level: T4 Additional Notes Negative paresthesia. Negative blood return. Positive free-flowing CSF. Expiration date of kit checked and confirmed. Patient tolerated procedure well, without complications. JA

## 2016-05-15 NOTE — Consult Note (Signed)
Neonatology Note:   Attendance at C-section:    I was asked by Dr. Valentino Saxon to attend this repeat C/S at term. The mother is a G2P1, GBS positive with good prenatal care. No fever, no labor. ROM 0 hours before delivery, fluid clear. Infant vigorous with good spontaneous cry and tone. Needed only minimal bulb suctioning. 60 sec DCC.  Ap 8/9. Lungs clear to ausc in DR. To CN to care of Pediatrician.  Michelle Kid Leary Roca, MD

## 2016-05-15 NOTE — Transfer of Care (Signed)
Immediate Anesthesia Transfer of Care Note  Patient: Michelle Owen  Procedure(s) Performed: Procedure(s): REPEAT CESAREAN SECTION (N/A)  Patient Location: PACU  Anesthesia Type:Spinal  Level of Consciousness: awake, alert  and oriented  Airway & Oxygen Therapy: Patient Spontanous Breathing  Post-op Assessment: Report given to RN and Post -op Vital signs reviewed and stable  Post vital signs: Reviewed and stable  Last Vitals:  Vitals:   05/15/16 1038 05/15/16 1501  BP:  (!) 122/107  Pulse:  72  Resp: 20 (!) 9  Temp: 36.7 C     Last Pain:  Vitals:   05/15/16 1038  TempSrc: Oral         Complications: No apparent anesthesia complications

## 2016-05-15 NOTE — Anesthesia Procedure Notes (Signed)
Spinal  Patient location during procedure: OR Staffing Anesthesiologist: Katy Fitch K Performed: anesthesiologist  Preanesthetic Checklist Completed: patient identified, site marked, surgical consent, pre-op evaluation, timeout performed, IV checked, risks and benefits discussed and monitors and equipment checked Spinal Block Patient position: sitting Prep: ChloraPrep Patient monitoring: heart rate, continuous pulse ox, blood pressure and cardiac monitor Approach: midline Location: L4-5 Injection technique: single-shot Needle Needle type: Whitacre and Introducer  Needle gauge: 24 G Needle length: 9 cm Assessment Sensory level: T10 Additional Notes Negative paresthesia. Negative blood return. Positive free-flowing CSF. Expiration date of kit checked and confirmed. Patient tolerated procedure well, without complications.

## 2016-05-15 NOTE — Plan of Care (Signed)
Pt here for scheduled. Surgery delayed due to an emergency c section on unit. Pt informed.

## 2016-05-15 NOTE — Op Note (Signed)
Cesarean Section Procedure Note  Indications: previous uterine incision (low-transverse) x 1  Pre-operative Diagnosis: 39 week 1 day pregnancy, chronic HTN in pregnancy, h/o prior C-section x 1 declining TOLAC.  Post-operative Diagnosis: same   Surgeon: Hildred Laser, MD  Assistants: Surgical scrub tech  Procedure: Repeat low transverse Cesarean Section  Anesthesia: Spinal anesthesia  Procedure Details: The patient was seen in the Holding Room. The risks, benefits, complications, treatment options, and expected outcomes were discussed with the patient.  The patient concurred with the proposed plan, giving informed consent.  The site of surgery properly noted/marked. The patient was taken to the Operating Room, identified as Michelle Owen and the procedure verified as C-Section Delivery. A Time Out was held and the above information confirmed.  After induction of anesthesia, the patient was draped and prepped in the usual sterile manner. Anesthesia was tested and noted to be adequate. A Pfannenstiel incision was made and carried down through the subcutaneous tissue to the fascia. Fascial incision was made and extended transversely. The fascia was separated from the underlying rectus tissue superiorly and inferiorly. The peritoneum was identified and entered. Peritoneal incision was extended longitudinally. The utero-vesical peritoneal reflection was incised transversely and the bladder flap was bluntly freed from the lower uterine segment. A low transverse uterine incision was made. Delivered from cephalic presentation was a 3150 gram Female with Apgar scores of 8 at one minute and 9 at five minutes.After the umbilical cord was clamped and cut, cord blood was obtained for evaluation. The placenta was removed intact and appeared normal. The uterus was exteriorized and cleared of all clots and debris. The uterine outline, tubes and ovaries appeared normal.  The uterine incision was closed with  running locked sutures of 0-Vicryl.  A second suture of 0-Vicryl was used in an imbricating layer.  Hemostasis was observed. Lavage was carried out until clear. The fascia was then reapproximated with a running suture of 0-Vicryl. The subcutaneous fat layer was reapproximated with 3-0 Vicryl.  The skin was reapproximated with 4-0 Monocryl.  Instrument, sponge, and needle counts were correct prior the abdominal closure and at the conclusion of the case.   Findings: Female/Female infant, cephalic presentation, 3150 grams, with Apgar scores of 8 at one minute and 9 at five minutes. Intact placenta with 3 vessel cord.  The uterine outline, tubes and ovaries appeared normal.  Minimal adhesions encountered.  Estimated Blood Loss:  500 ml      Drains: foley catheter to gravity drainage, 100 clear urine at end of the procedure         Total IV Fluids:  450 ml  Specimens: Cord blood         Implants: None         Complications:  None; patient tolerated the procedure well.         Disposition: PACU - hemodynamically stable.         Condition: stable   Hildred Laser, MD [facility]

## 2016-05-15 NOTE — Anesthesia Preprocedure Evaluation (Signed)
Anesthesia Evaluation  Patient identified by MRN, date of birth, ID band Patient awake    Reviewed: Allergy & Precautions, H&P , NPO status , Patient's Chart, lab work & pertinent test results, reviewed documented beta blocker date and time   Airway Mallampati: III  TM Distance: >3 FB Neck ROM: full    Dental no notable dental hx. (+) Teeth Intact   Pulmonary neg pulmonary ROS, shortness of breath and with exertion, former smoker,    Pulmonary exam normal breath sounds clear to auscultation       Cardiovascular Exercise Tolerance: Good hypertension, On Medications negative cardio ROS Normal cardiovascular exam Rhythm:regular Rate:Normal     Neuro/Psych negative neurological ROS  negative psych ROS   GI/Hepatic negative GI ROS, Neg liver ROS,   Endo/Other  negative endocrine ROS  Renal/GU negative Renal ROS  negative genitourinary   Musculoskeletal   Abdominal   Peds  Hematology negative hematology ROS (+)   Anesthesia Other Findings   Reproductive/Obstetrics (+) Pregnancy                             Anesthesia Physical Anesthesia Plan  ASA: II  Anesthesia Plan: Regional and Spinal   Post-op Pain Management:    Induction:   Airway Management Planned:   Additional Equipment:   Intra-op Plan:   Post-operative Plan:   Informed Consent: I have reviewed the patients History and Physical, chart, labs and discussed the procedure including the risks, benefits and alternatives for the proposed anesthesia with the patient or authorized representative who has indicated his/her understanding and acceptance.     Plan Discussed with: CRNA  Anesthesia Plan Comments:         Anesthesia Quick Evaluation

## 2016-05-16 ENCOUNTER — Encounter: Payer: Self-pay | Admitting: Obstetrics and Gynecology

## 2016-05-16 LAB — CBC
HCT: 31 % — ABNORMAL LOW (ref 35.0–47.0)
Hemoglobin: 10 g/dL — ABNORMAL LOW (ref 12.0–16.0)
MCH: 26 pg (ref 26.0–34.0)
MCHC: 32.1 g/dL (ref 32.0–36.0)
MCV: 81.1 fL (ref 80.0–100.0)
PLATELETS: 237 10*3/uL (ref 150–440)
RBC: 3.83 MIL/uL (ref 3.80–5.20)
RDW: 16 % — AB (ref 11.5–14.5)
WBC: 16.2 10*3/uL — AB (ref 3.6–11.0)

## 2016-05-16 MED ORDER — LIDOCAINE 5 % EX PTCH
1.0000 | MEDICATED_PATCH | CUTANEOUS | Status: DC
  Administered 2016-05-16: 1 via TRANSDERMAL
  Filled 2016-05-16: qty 1

## 2016-05-16 MED ORDER — PNEUMOCOCCAL VAC POLYVALENT 25 MCG/0.5ML IJ INJ
0.5000 mL | INJECTION | INTRAMUSCULAR | Status: DC
  Filled 2016-05-16: qty 0.5

## 2016-05-16 MED ORDER — LABETALOL HCL 100 MG PO TABS
100.0000 mg | ORAL_TABLET | Freq: Two times a day (BID) | ORAL | Status: DC
  Administered 2016-05-16 – 2016-05-17 (×3): 100 mg via ORAL
  Filled 2016-05-16 (×3): qty 1

## 2016-05-16 NOTE — Anesthesia Post-op Follow-up Note (Signed)
  Anesthesia Pain Follow-up Note  Patient: Michelle Owen  Day #: 1  Date of Follow-up: 05/16/2016 Time: 8:11 AM  Last Vitals:  Vitals:   05/16/16 0443 05/16/16 0740  BP: (!) 143/89 136/89  Pulse: 93 79  Resp: 20 18  Temp: 36.9 C 36.8 C    Level of Consciousness: alert  Pain: mild   Side Effects:None  Catheter Site Exam:site not evaluted    Plan: D/C from anesthesia care  Clydene Pugh

## 2016-05-16 NOTE — Anesthesia Postprocedure Evaluation (Signed)
Anesthesia Post Note  Patient: Kathleene Basilone  Procedure(s) Performed: Procedure(s) (LRB): REPEAT CESAREAN SECTION (N/A)  Patient location during evaluation: Mother Baby Anesthesia Type: Spinal Level of consciousness: oriented and awake and alert Pain management: pain level controlled Vital Signs Assessment: post-procedure vital signs reviewed and stable Respiratory status: respiratory function stable Cardiovascular status: blood pressure returned to baseline Postop Assessment: no backache, spinal receding, no headache, patient able to bend at knees, no signs of nausea or vomiting and adequate PO intake Anesthetic complications: no    Last Vitals:  Vitals:   05/16/16 0443 05/16/16 0740  BP: (!) 143/89 136/89  Pulse: 93 79  Resp: 20 18  Temp: 36.9 C 36.8 C    Last Pain:  Vitals:   05/16/16 0740  TempSrc: Oral  PainSc:                  Clydene Pugh

## 2016-05-16 NOTE — Progress Notes (Signed)
Postpartum Day # 1: Cesarean Delivery  Subjective: Patient reports tolerating PO, + flatus and no problems voiding.  Denies incision pain.     Objective: Vital signs in last 24 hours: Temp:  [98 F (36.7 C)-98.7 F (37.1 C)] 98 F (36.7 C) (07/27 2012) Pulse Rate:  [79-93] 87 (07/27 2012) Resp:  [18-20] 20 (07/27 2012) BP: (134-143)/(86-89) 140/86 (07/27 2012) SpO2:  [97 %-99 %] 97 % (07/27 0740)  Physical Exam:  General: alert and no distress Lungs: clear to auscultation bilaterally Breasts: normal appearance, no masses or tenderness Heart: regular rate and rhythm, S1, S2 normal, no murmur, click, rub or gallop Pelvis: Lochia appropriate, Uterine Fundus firm, Bandage moderately soaked with old blood.  Incision: healing well, no significant drainage, no dehiscence, no significant erythema Extremities: DVT Evaluation: Negative Homan's sign. No cords or calf tenderness. No significant calf/ankle edema.   Recent Labs  05/14/16 1045 05/16/16 0526  HGB 10.6* 10.0*  HCT 32.1* 31.0*    Assessment/Plan: Status post Cesarean section. Doing well postoperatively.  Plan for discharge tomorrow, Breastfeeding and Contraception IUD Advance diet Continue PO pain management Continue current care  Hildred Laser, MD [facility]

## 2016-05-17 ENCOUNTER — Encounter: Payer: Self-pay | Admitting: Emergency Medicine

## 2016-05-17 ENCOUNTER — Emergency Department
Admission: EM | Admit: 2016-05-17 | Discharge: 2016-05-18 | Disposition: A | Discharge status: Home / Self Care | Attending: Emergency Medicine | Admitting: Emergency Medicine

## 2016-05-17 DIAGNOSIS — R42 Dizziness and giddiness: Secondary | ICD-10-CM | POA: Insufficient documentation

## 2016-05-17 DIAGNOSIS — Z87891 Personal history of nicotine dependence: Secondary | ICD-10-CM | POA: Insufficient documentation

## 2016-05-17 DIAGNOSIS — N390 Urinary tract infection, site not specified: Secondary | ICD-10-CM | POA: Insufficient documentation

## 2016-05-17 DIAGNOSIS — I10 Essential (primary) hypertension: Secondary | ICD-10-CM | POA: Insufficient documentation

## 2016-05-17 DIAGNOSIS — N939 Abnormal uterine and vaginal bleeding, unspecified: Secondary | ICD-10-CM

## 2016-05-17 LAB — RUBELLA SCREEN: RUBELLA: 1.27 {index} (ref >0.99)

## 2016-05-17 LAB — VARICELLA ZOSTER ANTIBODY, IGG: Varicella IgG: 2893 index (ref >165)

## 2016-05-17 MED ORDER — OXYCODONE-ACETAMINOPHEN 5-325 MG PO TABS: 1.0000 | ORAL_TABLET | Freq: Four times a day (QID) | ORAL | 0 refills | Status: DC | PRN

## 2016-05-17 MED ORDER — MEASLES, MUMPS & RUBELLA VAC ~~LOC~~ INJ
0.5000 mL | INJECTION | Freq: Once | SUBCUTANEOUS | Status: AC
  Administered 2016-05-17: 0.5 mL via SUBCUTANEOUS
  Filled 2016-05-17: qty 0.5

## 2016-05-17 MED ORDER — DOCUSATE SODIUM 100 MG PO CAPS: 100.0000 mg | ORAL_CAPSULE | Freq: Two times a day (BID) | ORAL | 2 refills | Status: DC | PRN

## 2016-05-17 MED ORDER — IBUPROFEN 800 MG PO TABS: 800.0000 mg | ORAL_TABLET | Freq: Three times a day (TID) | ORAL | 1 refills | Status: DC | PRN

## 2016-05-17 NOTE — ED Triage Notes (Signed)
Pt to triage via wheelchair. Pt reports she was discharged today from [facility] after having a c-section on 05/15/16 by Dr Valentino Saxon. Pt reports this evening she was going to the bathroom and did not make it. States she urinated on herself. Pt reports she was getting herself cleaned up when she noticed a gush of blood come from her vagina. Pt approximates about an ounce of blood. Pt reports she got really hot and felt dizzy then like she was going to pass out. Pt states the sight of blood does not make her faint and she does not think seeing it is what made her feel like she is going to pass out.

## 2016-05-17 NOTE — Discharge Summary (Signed)
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 Prenatal Procedures: NST and ultrasound Intrapartum Procedures: cesarean: low cervical, transverse Postpartum Procedures: Rubella Ig Complications-Operative and Postpartum: none Hemoglobin  Date Value Ref Range Status  05/16/2016 10.0 (L) 12.0 - 16.0 g/dL Final   HCT  Date Value Ref Range Status  05/16/2016 31.0 (L) 35.0 - 47.0 % Final   Hematocrit  Date Value Ref Range Status  02/29/2016 35.4 34.0 - 46.6 % Final    Physical Exam:  General: alert and no distress Lochia: appropriate Uterine Fundus: firm Incision: healing well, no significant drainage, no dehiscence, no significant erythema DVT Evaluation: Negative Homan's sign. No cords or calf tenderness. No significant calf/ankle edema.  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  Hildred Laser 05/17/2016, 10:21 AM

## 2016-05-17 NOTE — Progress Notes (Signed)
Discharge instructions provided. Pt and sig other verbalize understanding of all instructions and follow-up care.  Prescriptions given.  Pt discharged to home with infant at 1308 on 05/17/16 via wheelchair by volunteer. Reynold Bowen, RN 05/17/2016 1:13 PM

## 2016-05-17 NOTE — Progress Notes (Signed)
Pt states that she received TDaP vaccine during pregnancy.  Pt declines TDaP vaccine at this time. Reynold Bowen, RN 05/17/2016 12:01 PM

## 2016-05-17 NOTE — Progress Notes (Signed)
Postpartum Day # 2: Cesarean Delivery  Subjective: Patient reports tolerating PO, + flatus and no problems voiding.  Denies complaints.      Objective: Vitals:   05/16/16 2012 05/16/16 2300 05/17/16 0332 05/17/16 0815  BP: 140/86   (!) 140/93  Pulse: 87   88  Resp: 20   16  Temp: 98 F (36.7 C) 99.2 F (37.3 C) 98.8 F (37.1 C) 98.4 F (36.9 C)  TempSrc: Oral Oral Oral Oral  SpO2:      Weight:      Height:        Physical Exam:  General: alert and no distress Lungs: clear to auscultation bilaterally Breasts: normal appearance, no masses or tenderness Heart: regular rate and rhythm, S1, S2 normal, no murmur, click, rub or gallop Pelvis: Lochia appropriate, Uterine Fundus firm, Incision: healing well, no significant drainage, no dehiscence, no significant erythema Extremities: DVT Evaluation: Negative Homan's sign. No cords or calf tenderness. No significant calf/ankle edema.   Recent Labs  05/14/16 1045 05/16/16 0526  HGB 10.6* 10.0*  HCT 32.1* 31.0*    Assessment/Plan: Status post Cesarean section. Doing well postoperatively.  Discharge home, Breastfeeding and Contraception Mirena IUD Regular diet Continue PO pain management Continue current care Continue Labetalol 100 mg BID for cHTN.   Hildred Laser, MD [facility]

## 2016-05-17 NOTE — Discharge Instructions (Signed)
Cesarean Delivery, Care After °Refer to this sheet in the next few weeks. These instructions provide you with information on caring for yourself after your procedure. Your health care provider may also give you specific instructions. Your treatment has been planned according to current medical practices, but problems sometimes occur. Call your health care provider if you have any problems or questions after you go home. °HOME CARE INSTRUCTIONS  °· Only take over-the-counter or prescription medications as directed by your health care provider. °· Do not drink alcohol, especially if you are breastfeeding or taking medication to relieve pain. °· Do not chew or smoke tobacco. °· Continue to use good perineal care. Good perineal care includes: °¨ Wiping your perineum from front to back. °¨ Keeping your perineum clean. °· Check your surgical cut (incision) daily for increased redness, drainage, swelling, or separation of skin. °· Clean your incision gently with soap and water every day, and then pat it dry. If your health care provider says it is okay, leave the incision uncovered. Use a bandage (dressing) if the incision is draining fluid or appears irritated. If the adhesive strips across the incision do not fall off within 7 days, carefully peel them off. °· Hug a pillow when coughing or sneezing until your incision is healed. This helps to relieve pain. °· Do not use tampons or douche until your health care provider says it is okay. °· Shower, wash your hair, and take tub baths as directed by your health care provider. °· Wear a well-fitting bra that provides breast support. °· Limit wearing support panties or control-top hose. °· Drink enough fluids to keep your urine clear or pale yellow. °· Eat high-fiber foods such as whole grain cereals and breads, brown rice, beans, and fresh fruits and vegetables every day. These foods may help prevent or relieve constipation. °· Resume activities such as climbing stairs,  driving, lifting, exercising, or traveling as directed by your health care provider. °· Talk to your health care provider about resuming sexual activities. This is dependent upon your risk of infection, your rate of healing, and your comfort and desire to resume sexual activity. °· Try to have someone help you with your household activities and your newborn for at least a few days after you leave the hospital. °· Rest as much as possible. Try to rest or take a nap when your newborn is sleeping. °· Increase your activities gradually. °· Keep all of your scheduled postpartum appointments. It is very important to keep your scheduled follow-up appointments. At these appointments, your health care provider will be checking to make sure that you are healing physically and emotionally. °SEEK MEDICAL CARE IF:  °· You are passing large clots from your vagina. Save any clots to show your health care provider. °· You have a foul smelling discharge from your vagina. °· You have trouble urinating. °· You are urinating frequently. °· You have pain when you urinate. °· You have a change in your bowel movements. °· You have increasing redness, pain, or swelling near your incision. °· You have pus draining from your incision. °· Your incision is separating. °· You have painful, hard, or reddened breasts. °· You have a severe headache. °· You have blurred vision or see spots. °· You feel sad or depressed. °· You have thoughts of hurting yourself or your newborn. °· You have questions about your care, the care of your newborn, or medications. °· You are dizzy or light-headed. °· You have a rash. °· You   have pain, redness, or swelling at the site of the removed intravenous access (IV) tube. °· You have nausea or vomiting. °· You stopped breastfeeding and have not had a menstrual period within 12 weeks of stopping. °· You are not breastfeeding and have not had a menstrual period within 12 weeks of delivery. °· You have a fever. °SEEK  IMMEDIATE MEDICAL CARE IF: °· You have persistent pain. °· You have chest pain. °· You have shortness of breath. °· You faint. °· You have leg pain. °· You have stomach pain. °· Your vaginal bleeding saturates 2 or more sanitary pads in 1 hour. °MAKE SURE YOU:  °· Understand these instructions. °· Will watch your condition. °· Will get help right away if you are not doing well or get worse. °  °This information is not intended to replace advice given to you by your health care provider. Make sure you discuss any questions you have with your health care provider. °  °Document Released: 06/29/2002 Document Revised: 10/28/2014 Document Reviewed: 06/03/2012 °Elsevier Interactive Patient Education ©2016 [employer] ° °Call your doctor for increased pain or vaginal bleeding, temperature above 100.4, depression, or concerns.  No strenuous activity or heavy lifting for 6 weeks.  No intercourse, tampons, douching, or enemas for 6 weeks.  No tub baths-showers only.  No driving for 2 weeks or while taking pain medications.  Continue prenatal vitamin and iron.  Keep incision clean and dry.  Call your doctor for incision concerns including redness, swelling, bleeding or drainage, or if begins to come apart.  Increase calories and fluids while breastfeeding. ° °

## 2016-05-18 LAB — CBC WITH DIFFERENTIAL/PLATELET
Basophils Absolute: 0 10*3/uL (ref 0–0.1)
Basophils Relative: 0 %
EOS ABS: 0.1 10*3/uL (ref 0–0.7)
EOS PCT: 1 %
HCT: 30.1 % — ABNORMAL LOW (ref 35.0–47.0)
HEMOGLOBIN: 9.9 g/dL — AB (ref 12.0–16.0)
LYMPHS ABS: 1.4 10*3/uL (ref 1.0–3.6)
LYMPHS PCT: 11 %
MCH: 26.2 pg (ref 26.0–34.0)
MCHC: 32.9 g/dL (ref 32.0–36.0)
MCV: 79.6 fL — AB (ref 80.0–100.0)
MONOS PCT: 6 %
Monocytes Absolute: 0.7 10*3/uL (ref 0.2–0.9)
Neutro Abs: 10.4 10*3/uL — ABNORMAL HIGH (ref 1.4–6.5)
Neutrophils Relative %: 82 %
PLATELETS: 319 10*3/uL (ref 150–440)
RBC: 3.78 MIL/uL — AB (ref 3.80–5.20)
RDW: 16.2 % — ABNORMAL HIGH (ref 11.5–14.5)
WBC: 12.7 10*3/uL — AB (ref 3.6–11.0)

## 2016-05-18 LAB — BASIC METABOLIC PANEL
Anion gap: 8 (ref 5–15)
BUN: 12 mg/dL (ref 6–20)
CHLORIDE: 104 mmol/L (ref 101–111)
CO2: 25 mmol/L (ref 22–32)
CREATININE: 0.66 mg/dL (ref 0.44–1.00)
Calcium: 8.5 mg/dL — ABNORMAL LOW (ref 8.9–10.3)
GFR calc Af Amer: >60 mL/min (ref >60)
GFR calc non Af Amer: >60 mL/min (ref >60)
GLUCOSE: 105 mg/dL — AB (ref 65–99)
POTASSIUM: 3.6 mmol/L (ref 3.5–5.1)
SODIUM: 137 mmol/L (ref 135–145)

## 2016-05-18 LAB — URINALYSIS COMPLETE WITH MICROSCOPIC (ARMC ONLY)
Bacteria, UA: NONE SEEN
Bilirubin Urine: NEGATIVE
Glucose, UA: NEGATIVE mg/dL
Ketones, ur: NEGATIVE mg/dL
Nitrite: NEGATIVE
PH: 6 (ref 5.0–8.0)
PROTEIN: 100 mg/dL — AB
SPECIFIC GRAVITY, URINE: 1.005 (ref 1.005–1.030)

## 2016-05-18 MED ORDER — CEPHALEXIN 500 MG PO CAPS: 500.0000 mg | ORAL_CAPSULE | Freq: Three times a day (TID) | ORAL | 0 refills | Status: DC

## 2016-05-18 MED ORDER — SODIUM CHLORIDE 0.9 % IV BOLUS (SEPSIS)
1000.0000 mL | Freq: Once | INTRAVENOUS | Status: AC
  Administered 2016-05-18: 1000 mL via INTRAVENOUS

## 2016-05-18 MED ORDER — DEXTROSE 5 % IV SOLN
1.0000 g | INTRAVENOUS | Status: DC
  Administered 2016-05-18: 1 g via INTRAVENOUS
  Filled 2016-05-18: qty 10

## 2016-05-18 MED ORDER — LABETALOL HCL 100 MG PO TABS
100.0000 mg | ORAL_TABLET | Freq: Once | ORAL | Status: AC
  Administered 2016-05-18: 100 mg via ORAL
  Filled 2016-05-18 (×2): qty 1

## 2016-05-18 NOTE — Discharge Instructions (Signed)
1. Take antibiotic as prescribed (Keflex 500 mg 3 times daily 7 days). 2. Stay indoors and drink plenty of fluids daily. 3. Return to the ER for worsening symptoms, persistent vomiting, difficulty breathing or other concerns.

## 2016-05-18 NOTE — ED Provider Notes (Signed)
[facility] Emergency Department Provider Note   ____________________________________________   First MD Initiated Contact with Patient 05/18/16 0033     (approximate)  I have reviewed the triage vital signs and the nursing notes.   HISTORY  Chief Complaint Vaginal Bleeding and Dizziness    HPI Michelle Owen is a 35 y.o. female who presents to the ED from home with the chief complaint of vaginal bleeding and dizziness. Patient was dischargedyesterday after having a C-section on 05/15/16 by Dr. Valentino Saxon. Reports "really needing to pee and not making it tonight." States she has been having vaginal bleeding but as she was cleaning herself she noted a gush of blood coming from her vagina. Subsequently she became very hot and dizzy and felt like she was going to pass out. This was patient's second C-section and she is not breast-feeding. Denies recent fever, chills, chest pain, shortness of breath, diarrhea, back pain. Denies recent travel or trauma. Nothing makes her symptoms better or worse. Patient states she has pregnancy-induced hypertension and is currently taking Labetalol but she missed her nighttime dose. States dizziness is better without intervention.   Past Medical History:  Diagnosis Date  . Anxiety   . Cramping of hands   . Edema   . Hypertension   . PVC (premature ventricular contraction)   . Shortness of breath dyspnea    related to pregnancy    Patient Active Problem List   Diagnosis Date Noted  . S/P cesarean section 05/15/2016  . SOB (shortness of breath) 01/15/2016  . Premature atrial complexes 01/15/2016  . Tachycardia with 100 - 120 beats per minute 01/15/2016  . Advanced maternal age in multigravida 11/21/2015  . Hypertension in pregnancy, pre-existing 11/21/2015  . H/O cesarean section complicating pregnancy 11/21/2015  . Anxiety 11/21/2015    Past Surgical History:  Procedure Laterality Date  . CESAREAN SECTION    .  CESAREAN SECTION N/A 05/15/2016   Procedure: REPEAT CESAREAN SECTION;  Surgeon: Hildred Laser, MD;  Location: [facility] ORS;  Service: Obstetrics;  Laterality: N/A;    Prior to Admission medications   Medication Sig Start Date End Date Taking? Authorizing Provider  docusate sodium (COLACE) 100 MG capsule Take 1 capsule (100 mg total) by mouth 2 (two) times daily as needed for mild constipation. 05/17/16  Yes Hildred Laser, MD  ibuprofen (ADVIL,MOTRIN) 800 MG tablet Take 1 tablet (800 mg total) by mouth every 8 (eight) hours as needed. 05/17/16  Yes Hildred Laser, MD  labetalol (NORMODYNE) 100 MG tablet Take 1 tablet (100 mg total) by mouth 2 (two) times daily. 10/24/15  Yes Ardelia Mems, CNM  oxyCODONE-acetaminophen (PERCOCET/ROXICET) 5-325 MG tablet Take 1-2 tablets by mouth every 6 (six) hours as needed (pain scale 4-7). 05/17/16  Yes Hildred Laser, MD  Prenatal Vit-Fe Fumarate-FA (EQL PRENATAL FORMULA) 28-0.8 MG TABS Take 1 tablet by mouth daily.    Yes Historical Provider, MD  cephALEXin (KEFLEX) 500 MG capsule Take 1 capsule (500 mg total) by mouth 3 (three) times daily. 05/18/16   Irean Hong, MD    Allergies Review of patient's allergies indicates no known allergies.  Family History  Problem Relation Age of Onset  . Diabetes Mother   . Diabetes Father   . Heart disease Father   . Heart failure Father   . Cancer Neg Hx     Social History Social History  Substance Use Topics  . Smoking status: Former Smoker    Quit date: 09/02/2015  . Smokeless tobacco:  Former Neurosurgeon    Quit date: 08/15/2015  . Alcohol use No    Review of Systems  Constitutional: No fever/chills. Eyes: No visual changes. ENT: No sore throat. Cardiovascular: Denies chest pain. Respiratory: Denies shortness of breath. Gastrointestinal: No abdominal pain.  No nausea, no vomiting.  No diarrhea.  No constipation. Genitourinary: Positive for urinary incontinence and vaginal bleeding. Negative for dysuria. Musculoskeletal:  Negative for back pain. Skin: Negative for rash. Neurological: Positive for dizziness. Negative for headaches, focal weakness or numbness.  10-point ROS otherwise negative.  ____________________________________________   PHYSICAL EXAM:  VITAL SIGNS: ED Triage Vitals  Enc Vitals Group     BP 05/17/16 2338 135/77     Pulse Rate 05/17/16 2338 84     Resp 05/17/16 2338 18     Temp 05/17/16 2338 98 F (36.7 C)     Temp Source 05/17/16 2338 Oral     SpO2 05/17/16 2338 97 %     Weight --      Height --      Head Circumference --      Peak Flow --      Pain Score 05/17/16 2330 3     Pain Loc --      Pain Edu? --      Excl. in GC? --     Constitutional: Alert and oriented. Well appearing and in no acute distress. Eyes: Conjunctivae are normal. PERRL. EOMI. Head: Atraumatic. Nose: No congestion/rhinnorhea. Mouth/Throat: Mucous membranes are moist.  Oropharynx non-erythematous. Neck: No stridor.   Cardiovascular: Normal rate, regular rhythm. Grossly normal heart sounds.  Good peripheral circulation. Respiratory: Normal respiratory effort.  No retractions. Lungs CTAB. Gastrointestinal: Wearing abdominal binder. Postop incision clean/dry/intact. Mild swelling to mons pubis. Soft and nontender. No distention. No abdominal bruits. No CVA tenderness. Musculoskeletal: No lower extremity tenderness nor edema.  No joint effusions. Neurologic:  Normal speech and language. No gross focal neurologic deficits are appreciated. No gait instability. Skin:  Skin is warm, dry and intact. No rash noted. Psychiatric: Mood and affect are normal. Speech and behavior are normal.  ____________________________________________   LABS (all labs ordered are listed, but only abnormal results are displayed)  Labs Reviewed  CBC WITH DIFFERENTIAL/PLATELET - Abnormal; Notable for the following:       Result Value   WBC 12.7 (*)    RBC 3.78 (*)    Hemoglobin 9.9 (*)    HCT 30.1 (*)    MCV 79.6 (*)     RDW 16.2 (*)    Neutro Abs 10.4 (*)    All other components within normal limits  BASIC METABOLIC PANEL - Abnormal; Notable for the following:    Glucose, Bld 105 (*)    Calcium 8.5 (*)    All other components within normal limits  URINALYSIS COMPLETEWITH MICROSCOPIC ([facility] ONLY) - Abnormal; Notable for the following:    Color, Urine RED (*)    APPearance HAZY (*)    Hgb urine dipstick 3+ (*)    Protein, ur 100 (*)    Leukocytes, UA 2+ (*)    Squamous Epithelial / LPF 0-5 (*)    All other components within normal limits   ____________________________________________  EKG  ED ECG REPORT I, Donavyn Fecher J, the attending physician, personally viewed and interpreted this ECG.   Date: 05/18/2016  EKG Time: 2327  Rate: 81  Rhythm: normal EKG, normal sinus rhythm  Axis: Normal  Intervals:none  ST&T Change: Nonspecific  ____________________________________________  RADIOLOGY  None ____________________________________________  PROCEDURES  Procedure(s) performed:  Pelvic exam: External exam WNL without rashes, lesions or vesicles. Speculum exam reveal mild vaginal bleeding. Bimanual exam WNL.  Procedures  Critical Care performed: No  ____________________________________________   INITIAL IMPRESSION / ASSESSMENT AND PLAN / ED COURSE  Pertinent labs & imaging results that were available during my care of the patient were reviewed by me and considered in my medical decision making (see chart for details).  35 year old female who presents 2 days s/p repeat C-section with urinary incontinence, vaginal bleeding and dizziness. Laboratory and urinalysis results remarkable for UTI, baseline anemia. Patient is mildly orthostatic; will initiate IV fluid bolus. Patient missed her nighttime labetalol and is hypertensive; will administer labetalol tablet.  Clinical Course  Comment By Time  IV antibiotic completed. IV fluids almost finished. Patient is feeling much better. She will  be discharged with a prescription for Keflex and see her OB/GYN early next week for follow-up. Strict return precautions given. Patient and mother verbalize understanding and agree with plan of care. Irean Hong, MD 07/29 0348  Pressure improved after labetalol; 136/83. Irean Hong, MD 07/29 0354     ____________________________________________   FINAL CLINICAL IMPRESSION(S) / ED DIAGNOSES  Final diagnoses:  Dizziness  UTI (lower urinary tract infection)  Vaginal bleeding      NEW MEDICATIONS STARTED DURING THIS VISIT:  New Prescriptions   CEPHALEXIN (KEFLEX) 500 MG CAPSULE    Take 1 capsule (500 mg total) by mouth 3 (three) times daily.     Note:  This document was prepared using Dragon voice recognition software and may include unintentional dictation errors.    Irean Hong, MD 05/18/16 [phone]

## 2016-05-21 ENCOUNTER — Telehealth: Payer: Self-pay | Admitting: Obstetrics and Gynecology

## 2016-05-21 MED ORDER — OXYCODONE-ACETAMINOPHEN 5-325 MG PO TABS: 1.0000 | ORAL_TABLET | Freq: Four times a day (QID) | ORAL | 0 refills | Status: DC | PRN

## 2016-05-21 NOTE — Telephone Encounter (Signed)
Please inform patient that I will prescribe an additional week's supply until she is able to come to the office for her post-op check. Also to alternate with Ibuprofen.

## 2016-05-21 NOTE — Telephone Encounter (Signed)
Please advise 

## 2016-05-21 NOTE — Telephone Encounter (Signed)
Patient called stating she ran out of her pain meds and is still in pain. She had c section last Wednesday. Please Advise

## 2016-05-22 NOTE — Telephone Encounter (Signed)
PT AWARE  

## 2016-05-27 NOTE — Addendum Note (Signed)
Addendum  created 05/27/16 1549 by Clelia Trabucco, MD   Cosign clinical note    

## 2016-05-28 ENCOUNTER — Encounter: Payer: Self-pay | Admitting: Obstetrics and Gynecology

## 2016-05-28 ENCOUNTER — Ambulatory Visit (INDEPENDENT_AMBULATORY_CARE_PROVIDER_SITE_OTHER): Admitting: Obstetrics and Gynecology

## 2016-05-28 VITALS — BP 126/80 | HR 93 | Ht 66.0 in | Wt 170.9 lb

## 2016-05-28 DIAGNOSIS — Z5189 Encounter for other specified aftercare: Secondary | ICD-10-CM

## 2016-05-28 DIAGNOSIS — Z98891 History of uterine scar from previous surgery: Secondary | ICD-10-CM

## 2016-05-28 DIAGNOSIS — I10 Essential (primary) hypertension: Secondary | ICD-10-CM

## 2016-05-28 MED ORDER — IBUPROFEN 800 MG PO TABS: 800.0000 mg | ORAL_TABLET | Freq: Three times a day (TID) | ORAL | 3 refills | Status: DC | PRN

## 2016-05-28 MED ORDER — LABETALOL HCL 100 MG PO TABS: 100.0000 mg | ORAL_TABLET | Freq: Two times a day (BID) | ORAL | 11 refills | Status: DC

## 2016-05-28 MED ORDER — MEDROXYPROGESTERONE ACETATE 150 MG/ML IM SUSP: 150.0000 mg | INTRAMUSCULAR | 3 refills | Status: AC

## 2016-05-28 MED ORDER — OXYCODONE-ACETAMINOPHEN 5-325 MG PO TABS: 1.0000 | ORAL_TABLET | Freq: Four times a day (QID) | ORAL | 0 refills | Status: DC | PRN

## 2016-05-28 NOTE — Progress Notes (Signed)
   GYNECOLOGY CLINIC PROGRESS NOTE  Subjective:     Michelle Owen is a 35 y.o. female who presents to the clinic 1 weeks status post repeat C-section for h/o prior C-section x 1. Eating a regular diet without difficulty. Bowel movements are normal. Pain is controlled with current analgesics. Medications being used: prescription NSAID's including ibuprofen (Motrin) and narcotic analgesics including oxycodone/acetaminophen (Percocet, Tylox).  Patient states that she needs refill on pain medications and BP meds.   The following portions of the patient's history were reviewed and updated as appropriate: allergies, current medications, past family history, past medical history, past social history, past surgical history and problem list.  Review of Systems Pertinent items noted in HPI and remainder of comprehensive ROS otherwise negative.    Objective:    BP 126/80   Pulse 93   Ht 5\' 6"  (1.676 m)   Wt 170 lb 14.4 oz (77.5 kg)   LMP 08/24/2015 (Exact Date)   BMI 27.58 kg/m  General:  alert and no distress  Abdomen: soft, bowel sounds active, non-tender  Incision:   healing well, no drainage, no erythema, no hernia, no seroma, no swelling, no dehiscence, incision well approximated     Assessment:   Doing well postoperatively.  Is s/p repeat C-section.  Post-operative pain Chronic HTN   Plan:    1. Continue any current medications.  Refills given on pain medications and BP meds.  BP controlled today.  2. Wound care discussed. 3. Activity restrictions: no bending, stooping, or squatting and no lifting more than 15 pounds and pelvic rest. 4. Anticipated return to work: 5 weeks. 5. Follow up: 4-5 weeks for postpartum check.  Patient notes that she desires Depo Provera for contraception.  Desires to receive at postpartum visit.  Will give prescription, patient to pick up and bring to next visit.    Hildred LaserAnika Shanna Strength, MD [facility]

## 2016-06-26 ENCOUNTER — Ambulatory Visit (INDEPENDENT_AMBULATORY_CARE_PROVIDER_SITE_OTHER): Admitting: Obstetrics and Gynecology

## 2016-06-26 DIAGNOSIS — Z3049 Encounter for surveillance of other contraceptives: Secondary | ICD-10-CM

## 2016-06-26 DIAGNOSIS — I10 Essential (primary) hypertension: Secondary | ICD-10-CM

## 2016-06-26 DIAGNOSIS — D649 Anemia, unspecified: Secondary | ICD-10-CM

## 2016-06-26 DIAGNOSIS — Z3042 Encounter for surveillance of injectable contraceptive: Secondary | ICD-10-CM

## 2016-06-26 MED ORDER — MEDROXYPROGESTERONE ACETATE 150 MG/ML IM SUSP
150.0000 mg | Freq: Once | INTRAMUSCULAR | Status: AC
  Administered 2016-06-26: 150 mg via INTRAMUSCULAR

## 2016-06-26 NOTE — Assessment & Plan Note (Signed)
Self-discontinued Labetalol 3 weeks ago, BPs wnl currently.

## 2016-06-26 NOTE — Progress Notes (Signed)
   OBSTETRICS POSTPARTUM CLINIC PROGRESS NOTE  Subjective:     Michelle Owen is a 35 y.o. [id number] female who presents for a postpartum visit. She is 6 weeks postpartum following a repeat low cervical transverse Cesarean section. I have fully reviewed the prenatal and intrapartum course. The delivery was at 39 gestational weeks.  Anesthesia: spinal. Postpartum course has been well. Baby's course has been well. Baby is feeding by bottle (formula). Bleeding: patient has resumed menses, with last menstrual period of 06/17/2016. Bowel function is normal. Bladder function is normal. Patient is not sexually active. Contraception method desired is Depo-Provera injections. Postpartum depression screening: negative (PHQ-9 score is 2).  The following portions of the patient's history were reviewed and updated as appropriate: allergies, current medications, past family history, past medical history, past social history, past surgical history and problem list.  Review of Systems A comprehensive review of systems was negative except for: Neurological: positive for headaches.  Menstrual migraine x 1. No prior h/o headaches.   Objective:    BP 117/68 (BP Location: Left Arm, Patient Position: Sitting, Cuff Size: Normal)   Pulse (!) 51   Ht 5' 6.5" (1.689 m)   Wt 166 lb 11.2 oz (75.6 kg)   LMP 06/17/2016   BMI 26.50 kg/m   General:  alert and no distress   Breasts:  inspection negative, no nipple discharge or bleeding, no masses or nodularity palpable  Lungs: clear to auscultation bilaterally  Heart:  regular rate and rhythm, S1, S2 normal, no murmur, click, rub or gallop  Abdomen: soft, non-tender; bowel sounds normal; no masses,  no organomegaly.  Well healed Pfannenstiel incision   Vulva:  normal  Vagina: normal vagina, no discharge, exudate, lesion, or erythema  Cervix:  no cervical motion tenderness and no lesions  Corpus: normal size, contour, position, consistency, mobility, non-tender    Adnexa:  normal adnexa and no mass, fullness, tenderness  Rectal Exam: Not performed.         Labs:  Lab Results  Component Value Date   HGB 9.9 (L) 05/18/2016    Assessment:   Routine postpartum exam, Is s/p repeat Cesarean section.   CHTN  Mild anemia, postpartum Contraception management  Plan:    1. Contraception: Depo-Provera injections.  Injection given today.  2. Will check Hgb for h/o anemia.  3. CHTN - stopped taking meds after 3 weeks postpartum, doing well so far.  Follow up in: 3 months for next Depo Provera injection.  4. F/u in 3-6 months for annual exam, either with GYN or with PCP.  or as needed.    Hildred LaserAnika Sharalee Witman, MD [facility]

## 2017-06-08 ENCOUNTER — Emergency Department
Admission: EM | Admit: 2017-06-08 | Discharge: 2017-06-08 | Disposition: A | Discharge status: Home / Self Care | Attending: Emergency Medicine | Admitting: Emergency Medicine

## 2017-06-08 DIAGNOSIS — Y939 Activity, unspecified: Secondary | ICD-10-CM | POA: Diagnosis not present

## 2017-06-08 DIAGNOSIS — Y999 Unspecified external cause status: Secondary | ICD-10-CM | POA: Insufficient documentation

## 2017-06-08 DIAGNOSIS — Y929 Unspecified place or not applicable: Secondary | ICD-10-CM | POA: Diagnosis not present

## 2017-06-08 DIAGNOSIS — S39012A Strain of muscle, fascia and tendon of lower back, initial encounter: Secondary | ICD-10-CM | POA: Diagnosis not present

## 2017-06-08 DIAGNOSIS — X500XXA Overexertion from strenuous movement or load, initial encounter: Secondary | ICD-10-CM | POA: Insufficient documentation

## 2017-06-08 DIAGNOSIS — I10 Essential (primary) hypertension: Secondary | ICD-10-CM | POA: Diagnosis not present

## 2017-06-08 DIAGNOSIS — Z87891 Personal history of nicotine dependence: Secondary | ICD-10-CM | POA: Insufficient documentation

## 2017-06-08 DIAGNOSIS — M545 Low back pain: Secondary | ICD-10-CM | POA: Diagnosis present

## 2017-06-08 DIAGNOSIS — Z79899 Other long term (current) drug therapy: Secondary | ICD-10-CM | POA: Insufficient documentation

## 2017-06-08 LAB — POCT PREGNANCY, URINE: Preg Test, Ur: NEGATIVE

## 2017-06-08 MED ORDER — ETODOLAC 400 MG PO TABS: 400.0000 mg | ORAL_TABLET | Freq: Two times a day (BID) | ORAL | 0 refills | Status: AC

## 2017-06-08 MED ORDER — METHOCARBAMOL 500 MG PO TABS: ORAL_TABLET | ORAL | 0 refills | Status: AC

## 2017-06-08 MED ORDER — ORPHENADRINE CITRATE 30 MG/ML IJ SOLN
60.0000 mg | Freq: Two times a day (BID) | INTRAMUSCULAR | Status: DC
  Administered 2017-06-08: 60 mg via INTRAMUSCULAR
  Filled 2017-06-08: qty 2

## 2017-06-08 MED ORDER — HYDROCODONE-ACETAMINOPHEN 5-325 MG PO TABS: 1.0000 | ORAL_TABLET | Freq: Four times a day (QID) | ORAL | 0 refills | Status: AC | PRN

## 2017-06-08 MED ORDER — HYDROCODONE-ACETAMINOPHEN 5-325 MG PO TABS
1.0000 | ORAL_TABLET | Freq: Once | ORAL | Status: AC
  Administered 2017-06-08: 1 via ORAL
  Filled 2017-06-08: qty 1

## 2017-06-08 NOTE — ED Triage Notes (Signed)
C/o lower back pain after bending over to pick up her child yesterday. Pt in wheelchair upon arrival. Pt alert and oriented X4, active, cooperative, pt in NAD. RR even and unlabored, color WNL.

## 2017-06-08 NOTE — ED Provider Notes (Signed)
[facility] Emergency Department Provider Note  ____________________________________________   First MD Initiated Contact with Patient 06/08/17 0801     (approximate)  I have reviewed the triage vital signs and the nursing notes.   HISTORY  Chief Complaint Back Pain   HPI Michelle Owen is a 36 y.o. female is a complaint of low back pain. Patient states she bent over to pick up her child yesterday which she immediately felt some pain in her lower back. Patient denies previous back problems. She denies any urinary symptoms, incontinence of bowel or bladder, or paresthesias. Patient has continued to ambulate. Today muscle spasms are much worse. Currently she rates her pain as a 9/10.   Past Medical History:  Diagnosis Date  . Anxiety   . Cramping of hands   . Edema   . Hypertension   . PVC (premature ventricular contraction)   . Shortness of breath dyspnea    related to pregnancy    Patient Active Problem List   Diagnosis Date Noted  . S/P cesarean section 05/15/2016  . SOB (shortness of breath) 01/15/2016  . Premature atrial complexes 01/15/2016  . Tachycardia with 100 - 120 beats per minute 01/15/2016  . Hypertension 11/21/2015  . Anxiety 11/21/2015    Past Surgical History:  Procedure Laterality Date  . CESAREAN SECTION    . CESAREAN SECTION N/A 05/15/2016   Procedure: REPEAT CESAREAN SECTION;  Surgeon: Hildred Laser, MD;  Location: [facility] ORS;  Service: Obstetrics;  Laterality: N/A;    Prior to Admission medications   Medication Sig Start Date End Date Taking? Authorizing Provider  etodolac (LODINE) 400 MG tablet Take 1 tablet (400 mg total) by mouth 2 (two) times daily. 06/08/17   Tommi Rumps, PA-C  HYDROcodone-acetaminophen (NORCO/VICODIN) 5-325 MG tablet Take 1 tablet by mouth every 6 (six) hours as needed for moderate pain. 06/08/17   Tommi Rumps, PA-C  medroxyPROGESTERone (DEPO-PROVERA) 150 MG/ML injection Inject 1 mL  (150 mg total) into the muscle every 3 (three) months. 05/28/16   Hildred Laser, MD  methocarbamol (ROBAXIN) 500 MG tablet 1-2 tablets qid prn muscle spasms 06/08/17   Tommi Rumps, PA-C    Allergies Patient has no known allergies.  Family History  Problem Relation Age of Onset  . Diabetes Mother   . Diabetes Father   . Heart disease Father   . Heart failure Father   . Cancer Neg Hx     Social History Social History  Substance Use Topics  . Smoking status: Former Smoker    Quit date: 09/02/2015  . Smokeless tobacco: Former Neurosurgeon    Quit date: 08/15/2015  . Alcohol use No    Review of Systems Constitutional: No fever/chills Cardiovascular: Denies chest pain. Respiratory: Denies shortness of breath. Gastrointestinal: No abdominal pain.  No nausea, no vomiting.  Genitourinary: Negative for dysuria. Musculoskeletal: Positive for low back pain. Skin: Negative for rash. Neurological: Negative for headaches, focal weakness or numbness.   ____________________________________________   PHYSICAL EXAM:  VITAL SIGNS: ED Triage Vitals  Enc Vitals Group     BP 06/08/17 0726 (!) 129/92     Pulse Rate 06/08/17 0726 72     Resp 06/08/17 0726 16     Temp --      Temp src --      SpO2 06/08/17 0726 100 %     Weight 06/08/17 0727 155 lb (70.3 kg)     Height 06/08/17 0727 5\' 6"  (1.676 m)  Head Circumference --      Peak Flow --      Pain Score 06/08/17 0726 9     Pain Loc --      Pain Edu? --      Excl. in GC? --    Constitutional: Alert and oriented. Well appearing and in no acute distress. Eyes: Conjunctivae are normal. PERRL. EOMI. Head: Atraumatic. Neck: No stridor.   Cardiovascular: Normal rate, regular rhythm. Grossly normal heart sounds.  Good peripheral circulation. Respiratory: Normal respiratory effort.  No retractions. Lungs CTAB. Musculoskeletal: On examination of patient's back there is no gross deformity however patient is having a great deal of  difficulty getting from a supine position to sitting for examination and moderate amount of muscle spasms. There is no point tenderness on palpation of the spinous processes of the lumbar spine but moderate paravertebral muscle tenderness. Range of motion was difficult secondary to pain. Straight leg raises were approximately 10 secondary to difficulty with any movement. Neurologic:  Normal speech and language. No gross focal neurologic deficits are appreciated.  Skin:  Skin is warm, dry and intact. No rash noted. Psychiatric: Mood and affect are normal. Speech and behavior are normal.  ____________________________________________   LABS (all labs ordered are listed, but only abnormal results are displayed)  Labs Reviewed  POC URINE PREG, ED  POCT PREGNANCY, URINE     PROCEDURES  Procedure(s) performed: None  Procedures  Critical Care performed: No  ____________________________________________   INITIAL IMPRESSION / ASSESSMENT AND PLAN / ED COURSE  Pertinent labs & imaging results that were available during my care of the patient were reviewed by me and considered in my medical decision making (see chart for details).  ----------------------------------------- 9:33 AM on 06/08/2017 ----------------------------------------- Patient is resting more comfortably and is able to move with less pain. Muscle spasms are under control. We discussed continuing with Robaxin 1 or 2 tablets every 6 hours as needed for muscle spasms. Norco one every 6 hours as needed for pain. Etodolac 400 mg twice a day with food. Patient is to use ice or heat to her back for comfort. Patient is follow-up with her PCP if any continued problems. Patient is aware that she cannot take pain medication or muscle relaxant and drive.   ____________________________________________   FINAL CLINICAL IMPRESSION(S) / ED DIAGNOSES  Final diagnoses:  Acute myofascial strain of lumbar region, initial encounter       NEW MEDICATIONS STARTED DURING THIS VISIT:  New Prescriptions   ETODOLAC (LODINE) 400 MG TABLET    Take 1 tablet (400 mg total) by mouth 2 (two) times daily.   HYDROCODONE-ACETAMINOPHEN (NORCO/VICODIN) 5-325 MG TABLET    Take 1 tablet by mouth every 6 (six) hours as needed for moderate pain.   METHOCARBAMOL (ROBAXIN) 500 MG TABLET    1-2 tablets qid prn muscle spasms     Note:  This document was prepared using Dragon voice recognition software and may include unintentional dictation errors.    Tommi Rumps, PA-C 06/08/17 1028    Tommi Rumps, PA-C 06/08/17 1345    Phineas Semen, MD 06/08/17 [phone]

## 2017-06-08 NOTE — ED Notes (Signed)
Unable to take oral temperature due to pt just drinking fluid.

## 2017-06-08 NOTE — Discharge Instructions (Addendum)
Follow-up with your primary care doctor if any continued palms with your back. Try ice or heat to your back as needed for comfort. Continue taking Robaxin and Norco as needed for muscle spasms and pain. These are the 2 medications that  cannot take and drive or operate machinery. He may take etodolac 400 mg twice a day with food. This medication will not cause drowsiness but will help with inflammation and pain. Do not lift anything more than 5 pounds by your back is healing.

## 2017-09-09 IMAGING — MR MR SHOULDER*R* WO/W CM
9 series · 40 of 40 positions shown · IV contrast (multihance)
Comparison: Radiographs 03/25/2015

CLINICAL DATA: Injured posterior shoulder and scapula in March 2015.
Persistent numbness, tingling and burning sensation.

EXAM:
MRI BRACHIAL PLEXUS WITHOUT AND WITH CONTRAST
TECHNIQUE: Multiplanar, multiecho pulse sequences of the neck and surrounding
structures were obtained without and with intravenous contrast. The
field of view was focused on the right brachial plexus from the
neural foramina to the axilla.
CONTRAST:  14mL MULTIHANCE GADOBENATE DIMEGLUMINE 529 MG/ML IV SOLN

[Series 2: t1_axial · axial · 5.0mm · 1.06mm/px · z∈[-50,+82]mm · 4 of 23 slices shown]
[im 1/23]
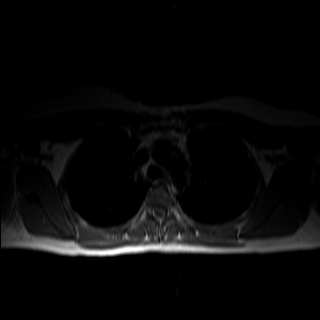
[im 8/23]
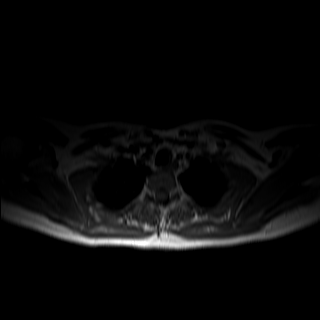
[im 15/23]
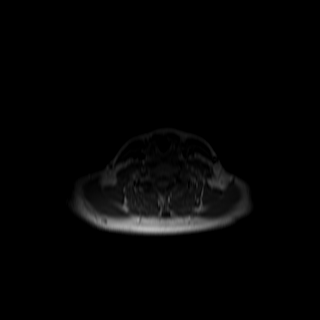
[im 23/23]
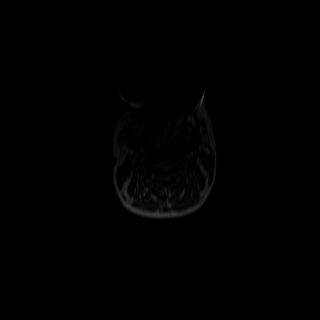

[Series 3: T2 fat-sat · axial · 5.0mm · 1.33mm/px · z∈[-50,+82]mm · 3 of 23 slices shown]
[im 1/23]
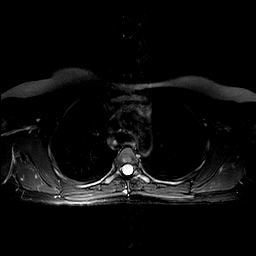
[im 12/23]
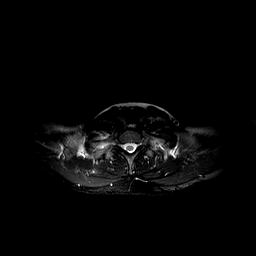
[im 23/23]
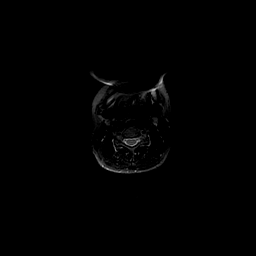

[Series 4: t1_cor_ · coronal · 4.0mm · 0.75mm/px · 4 of 30 slices shown]
[im 1/30]
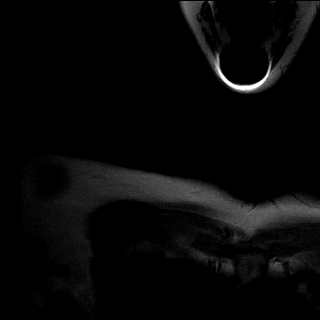
[im 10/30]
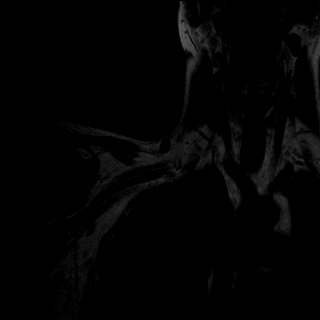
[im 20/30]
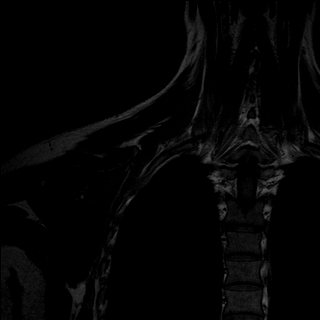
[im 30/30]
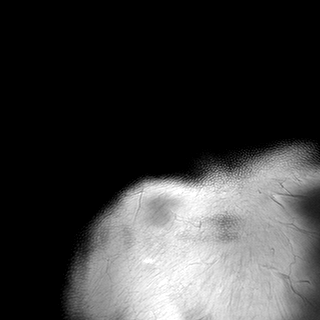

[Series 6: T1 · sagittal · 4.0mm · 0.94mm/px · 6 of 40 slices shown]
[im 1/40]
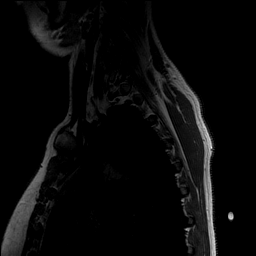
[im 8/40]
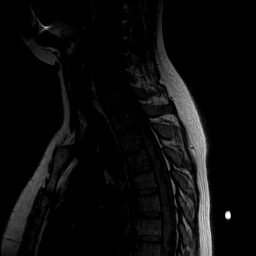
[im 16/40]
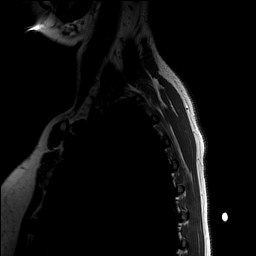
[im 24/40]
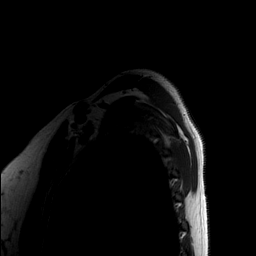
[im 32/40]
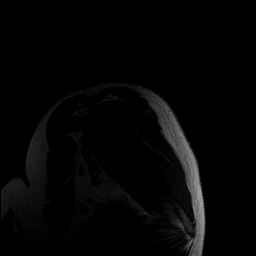
[im 40/40]
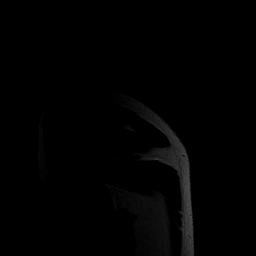

[Series 10: cor stir_fil_1 · coronal · 4.0mm · 0.94mm/px · 4 of 30 slices shown]
[im 1/30]
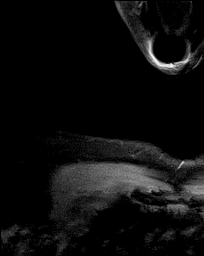
[im 10/30]
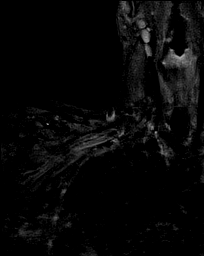
[im 20/30]
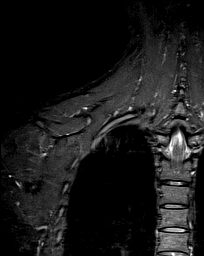
[im 30/30]
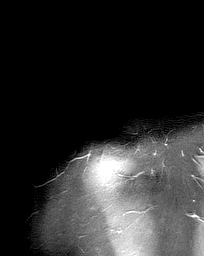

[Series 12: t1_axial fs post_fil_1 · axial · 5.0mm · 1.06mm/px · z∈[-50,+82]mm · 3 of 23 slices shown]
[im 1/23]
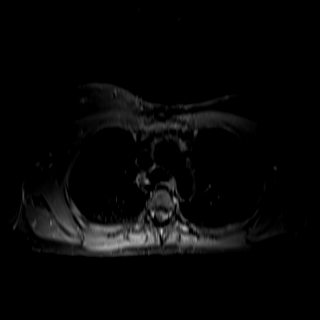
[im 12/23]
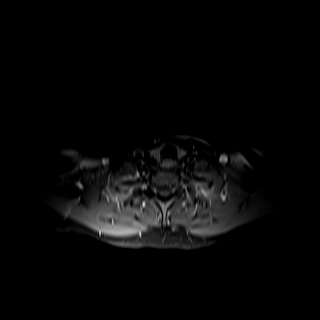
[im 23/23]
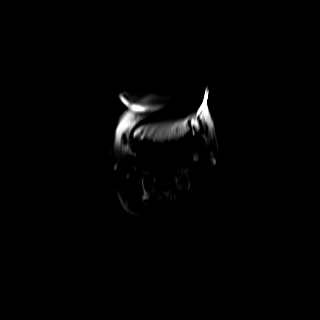

[Series 14: t1_cor_fs post_fil_1 · coronal · 4.0mm · 0.94mm/px · 4 of 30 slices shown]
[im 1/30]
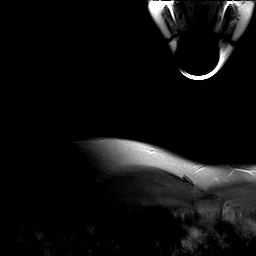
[im 10/30]
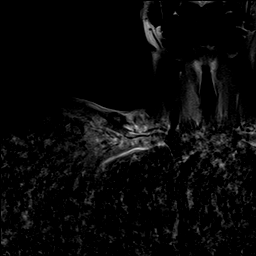
[im 20/30]
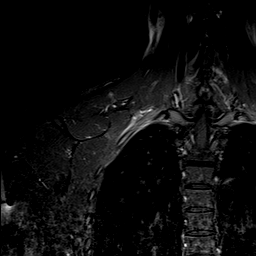
[im 30/30]
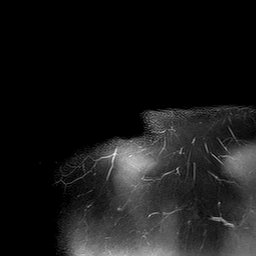

[Series 16: sagittal t2_fil_2 · sagittal · 4.0mm · 0.94mm/px · 6 of 40 slices shown]
[im 1/40]
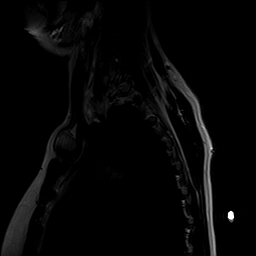
[im 8/40]
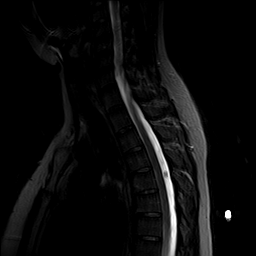
[im 16/40]
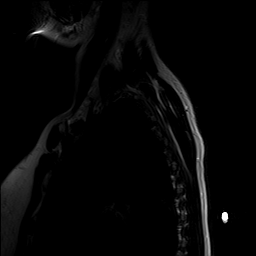
[im 24/40]
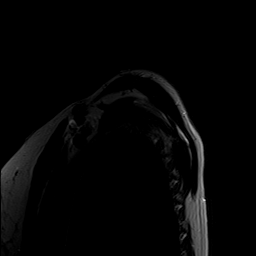
[im 32/40]
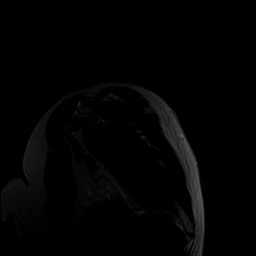
[im 40/40]
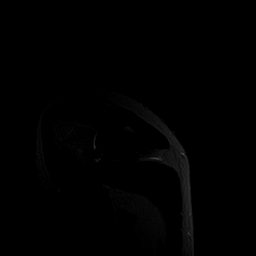

[Series 17: T1 fat-sat · sagittal · 4.0mm · 0.94mm/px · 6 of 40 slices shown]
[im 1/40]
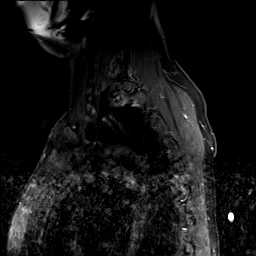
[im 8/40]
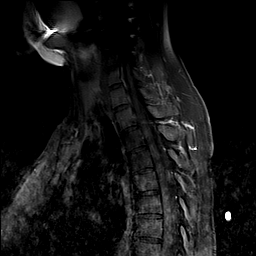
[im 16/40]
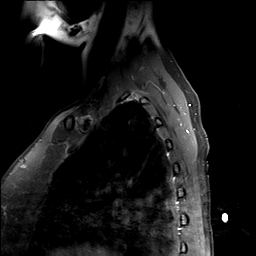
[im 24/40]
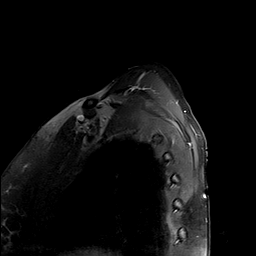
[im 32/40]
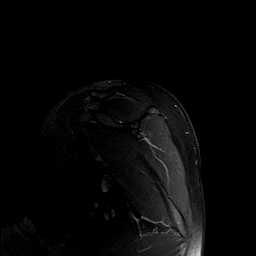
[im 40/40]
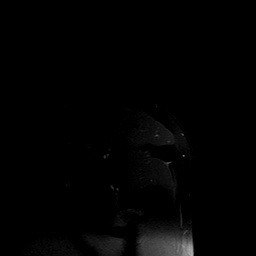

[40 of 40 positions shown; findings below may reference images not displayed]

FINDINGS: The brachials plexus studies are normal. No mass or mass effect.
There are surrounded by fat along the entire course. No edema or
enhancement is identified.

The scapula is normal. No fracture. The surrounding shoulder
musculature demonstrates normal signal intensity. The visualized
lungs are grossly clear. No apical lung lesion.
IMPRESSION: Unremarkable MR examination of the right tracheal plexus and
scapula.

## 2018-03-10 IMAGING — CR DG CHEST 2V
1 series · 2 of 2 positions shown · non-contrast
Comparison: None.

CLINICAL DATA: Short of breath.  Pregnant.

EXAM:
CHEST  2 VIEW

[Series 1: dg chest 2 view · 0.14mm/px · 2 of 2 slices shown]
[im 1/2]
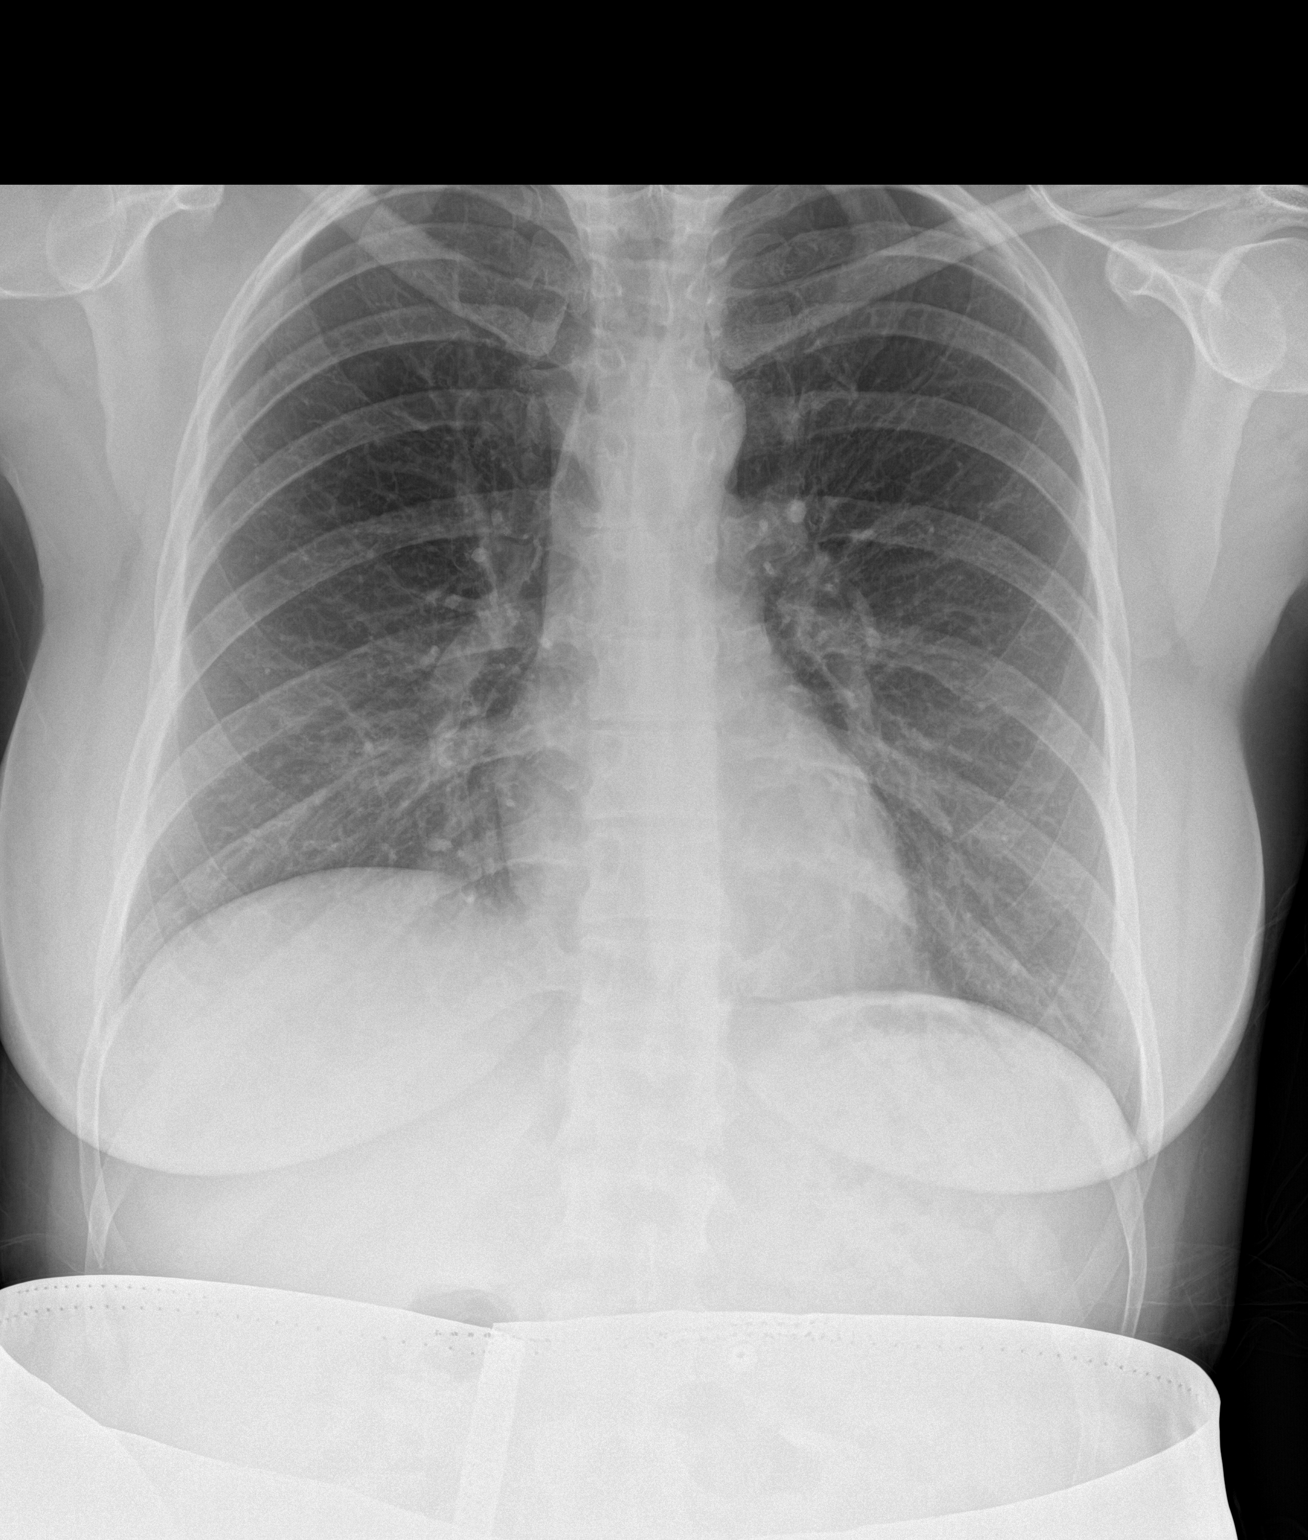
[im 2/2]
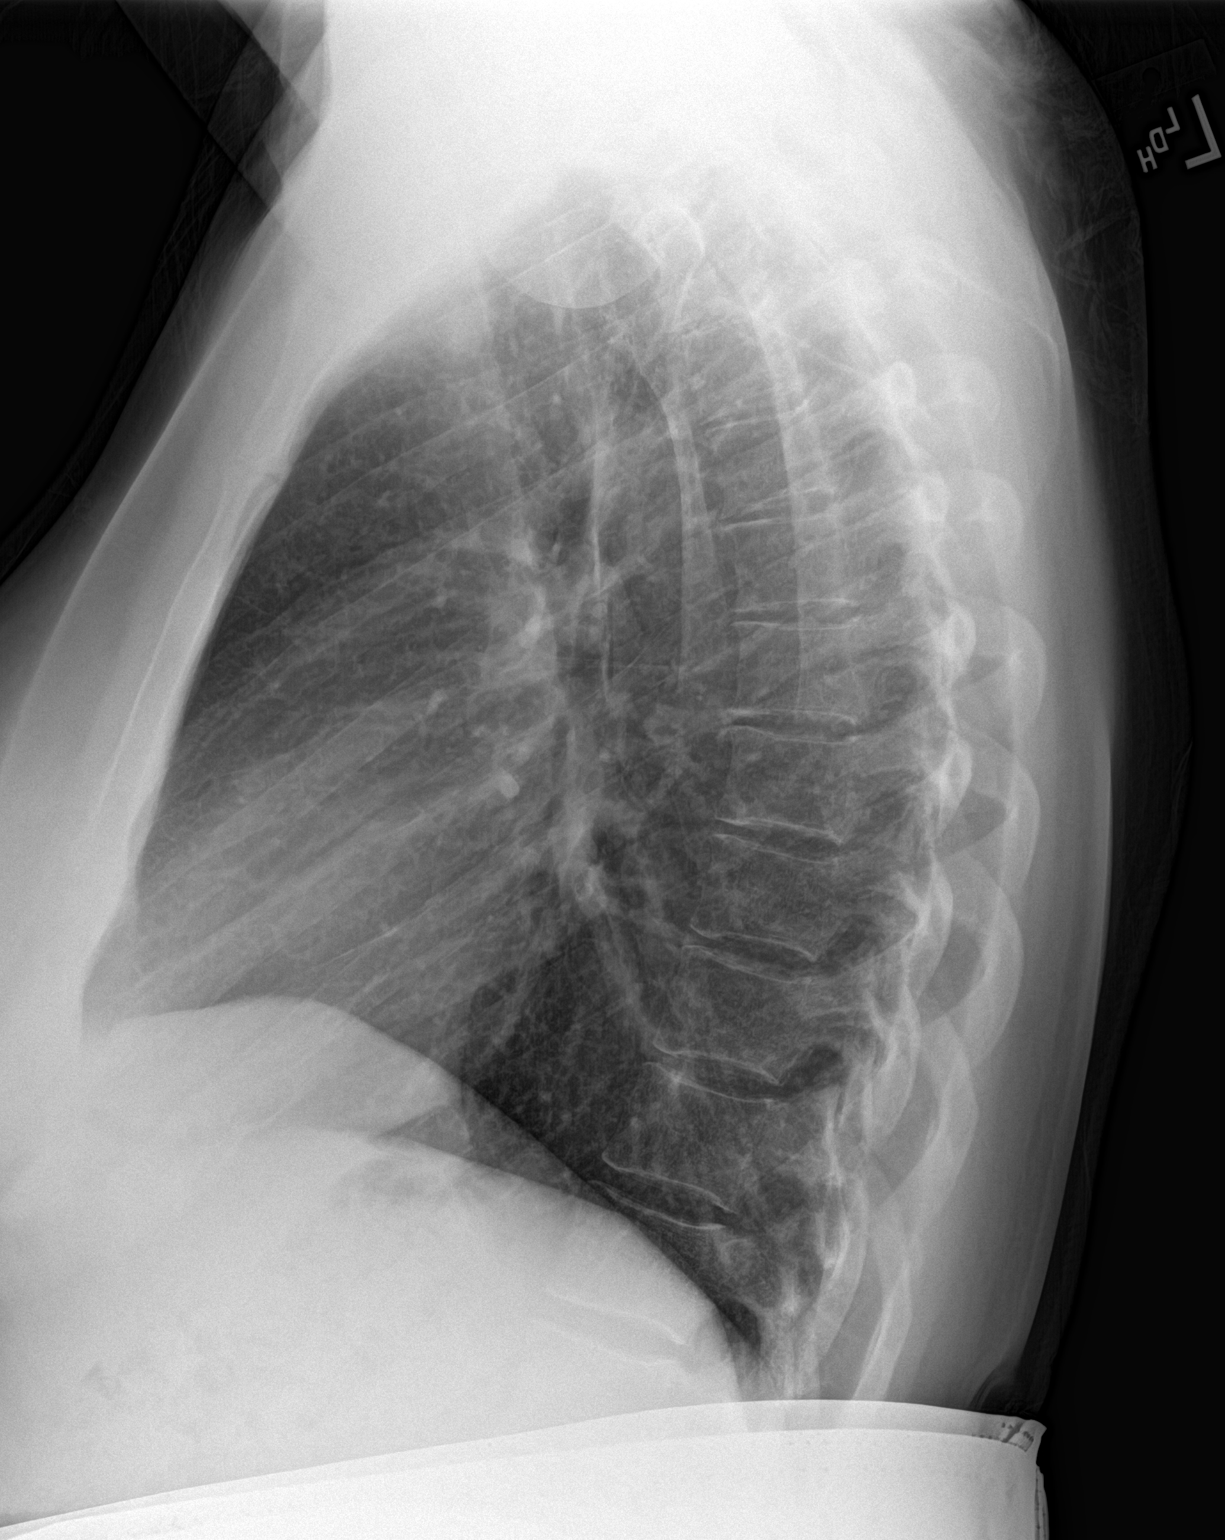

[2 of 2 positions shown; findings below may reference images not displayed]

FINDINGS: Normal heart size. Lungs clear. No pneumothorax. No pleural
effusion.
IMPRESSION: No active cardiopulmonary disease.
